# Patient Record
Sex: Male | Born: 1950 | Race: White | Hispanic: No | Marital: Married | Smoking: Never smoker
Health system: Southern US, Community
[De-identification: ages and names within clinical notes are randomized; demographics above are authoritative.]

## PROBLEM LIST (undated history)

## (undated) DIAGNOSIS — Z5189 Encounter for other specified aftercare: Secondary | ICD-10-CM

## (undated) DIAGNOSIS — T7840XA Allergy, unspecified, initial encounter: Secondary | ICD-10-CM

## (undated) DIAGNOSIS — R9431 Abnormal electrocardiogram [ECG] [EKG]: Principal | ICD-10-CM

## (undated) DIAGNOSIS — I10 Essential (primary) hypertension: Secondary | ICD-10-CM

## (undated) DIAGNOSIS — C801 Malignant (primary) neoplasm, unspecified: Secondary | ICD-10-CM

## (undated) HISTORY — PX: LUMBAR LAMINECTOMY: SHX95

## (undated) HISTORY — DX: Abnormal electrocardiogram (ECG) (EKG): R94.31

## (undated) HISTORY — PX: OTHER SURGICAL HISTORY: SHX169

## (undated) HISTORY — DX: Essential (primary) hypertension: I10

## (undated) HISTORY — DX: Allergy, unspecified, initial encounter: T78.40XA

## (undated) HISTORY — DX: Malignant (primary) neoplasm, unspecified: C80.1

## (undated) HISTORY — PX: PROSTATECTOMY: SHX69

## (undated) HISTORY — DX: Encounter for other specified aftercare: Z51.89

---

## 1983-08-08 HISTORY — PX: BACK SURGERY: SHX140

## 2001-12-13 ENCOUNTER — Encounter: Payer: Self-pay | Admitting: Emergency Medicine

## 2001-12-13 ENCOUNTER — Emergency Department (HOSPITAL_COMMUNITY): Admission: EM | Admit: 2001-12-13 | Discharge: 2001-12-13 | Payer: Self-pay | Admitting: Emergency Medicine

## 2001-12-17 ENCOUNTER — Emergency Department (HOSPITAL_COMMUNITY): Admission: EM | Admit: 2001-12-17 | Discharge: 2001-12-17 | Payer: Self-pay | Admitting: Emergency Medicine

## 2001-12-17 ENCOUNTER — Encounter: Payer: Self-pay | Admitting: Urology

## 2001-12-19 ENCOUNTER — Encounter: Payer: Self-pay | Admitting: Urology

## 2001-12-19 ENCOUNTER — Ambulatory Visit (HOSPITAL_BASED_OUTPATIENT_CLINIC_OR_DEPARTMENT_OTHER): Admission: RE | Admit: 2001-12-19 | Discharge: 2001-12-19 | Payer: Self-pay | Admitting: Urology

## 2003-09-17 ENCOUNTER — Emergency Department (HOSPITAL_COMMUNITY): Admission: EM | Admit: 2003-09-17 | Discharge: 2003-09-17 | Payer: Self-pay | Admitting: Emergency Medicine

## 2007-06-12 ENCOUNTER — Ambulatory Visit (HOSPITAL_COMMUNITY): Admission: RE | Admit: 2007-06-12 | Discharge: 2007-06-12 | Payer: Self-pay | Admitting: Urology

## 2007-08-08 HISTORY — PX: LYMPHADENECTOMY: SHX15

## 2007-08-29 ENCOUNTER — Inpatient Hospital Stay (HOSPITAL_COMMUNITY): Admission: RE | Admit: 2007-08-29 | Discharge: 2007-08-30 | Payer: Self-pay | Admitting: Urology

## 2007-08-29 ENCOUNTER — Encounter: Payer: Self-pay | Admitting: Urology

## 2008-06-04 ENCOUNTER — Ambulatory Visit: Payer: Self-pay | Admitting: Family Medicine

## 2008-06-04 DIAGNOSIS — C61 Malignant neoplasm of prostate: Secondary | ICD-10-CM

## 2008-06-04 DIAGNOSIS — I10 Essential (primary) hypertension: Secondary | ICD-10-CM

## 2008-06-04 DIAGNOSIS — E785 Hyperlipidemia, unspecified: Secondary | ICD-10-CM

## 2009-01-13 ENCOUNTER — Ambulatory Visit (HOSPITAL_BASED_OUTPATIENT_CLINIC_OR_DEPARTMENT_OTHER): Admission: RE | Admit: 2009-01-13 | Discharge: 2009-01-13 | Payer: Self-pay | Admitting: Specialist

## 2010-11-14 LAB — POCT I-STAT 4, (NA,K, GLUC, HGB,HCT)
Glucose, Bld: 103 mg/dL — ABNORMAL HIGH (ref 70–99)
Potassium: 4 mEq/L (ref 3.5–5.1)

## 2010-12-20 NOTE — Op Note (Signed)
NAME:  Nicholas Wallace, Nicholas Wallace NO.:  [id number]   MEDICAL RECORD NO.:  [id number]          PATIENT TYPE:  INP   LOCATION:  0004                         FACILITY:  [facility]   PHYSICIAN:  Excell Seltzer. Annabell Howells, M.D.    DATE OF BIRTH:  06/14/1951   DATE OF PROCEDURE:  08/29/2007  DATE OF DISCHARGE:                               OPERATIVE REPORT   PROCEDURE:  Robot-assisted laparoscopic radical prostatectomy with  pelvic lymphadenectomy.   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   POSTOPERATIVE DIAGNOSIS:  T1C Gleason 7, 4 + 3, adenocarcinoma of the  prostate.   SURGEON:  Dr. Bjorn Pippin.   ASSISTANT:  Dr. Heloise Purpura.   ANESTHESIA:  General.   SPECIMEN:  Prostate and seminal vesicles right and left pelvic lymph  nodes.   DRAINS:  #10 Blake drain and 20-French coude Foley catheter.   BLOOD LOSS:  200 mL.   COMPLICATIONS:  None.   INDICATIONS:  Mr. Avakian is a 60 year old white male who was found to  have an elevated PSA of 4.3.  Biopsy demonstrated a T1C Gleason 7, 4 +  3, adenocarcinoma of prostate involving the right apical biopsies.  He  elected radical prostatectomy.  It was felt that node dissection was  indicated because of the grade of his disease.   FINDINGS OF PROCEDURE:  The patient was given Ancef.  He was fitted with  PAS hose.  He was taken to the operating room where general anesthetic  was induced.  He was placed in the lithotomy position.  All pressure  points were appropriately padded.  A red rubber rectal catheter was  placed and secured and connected to an A[place] syringe.  His abdomen was  clipped.  He was prepped with Betadine solution and draped in usual  sterile fashion.  A Foley catheter was inserted, and the bladder was  drained.  At this point, the camera port for the robot was placed 18 cm  superior to the pubis to the left of the umbilicus through a 2-cm  incision and was carried down through the  subcutaneous tissues and  anterior rectus fascia with a Bovie, followed by a nick in posterior  sheath and entry into the abdomen with the tip of a hemostat.  Finger  was placed within the port site to ensure appropriate placement.  A 12-  mm camera port was then placed.  Figure-of-eight 0 Vicryl was used to  occlude the incision around the port.  The laparoscopic camera was then  placed, and the abdomen was inspected.  No evidence of adhesions were  noted.  With the abdomen under insufflation, the remaining port sites  including three 8-mm ports, a 12-mm assistant port, and a 5-mm assistant  port were marked, infiltrated local anesthetic and placed in the  standard configuration.  Once the ports were all positioned, the robot  was brought in the field and docked.  The patient had been positioned in  steep Trendelenburg as per routine.  Once the robot was docked, the  dissection was initiated by taking  down the bladder from the anterior  abdominal wall.  The obliterated umbilical arteries were divided  followed by the urachus.  The bladder had been filled for this portion  of procedure.  It was then drained.  The anterior prostate was then  exposed and then defatted.  The endopelvic fascia was incised on the  right, and the lateral plane was developed.  The endopelvic fascia was  incised on the left, and lateral plane was developed.  The pubic  prostatic ligaments were taken down, the dorsal vein was identified  along the groove adjacent to the urethra on each side.  An Endo-GIA was  then used to divide the dorsal vein complex in the usual fashion.  Once  the dorsal vein complex had been divided, the anterior bladder neck was  identified with traction on the Foley and then divided.  Once the  bladder neck had been entered, the Foley catheter was identified and  brought out into the wound and used to provide anterior traction.  The  posterior bladder neck was then divided with great care  taken to avoid  posterior injury to the bladder or ureters.  The dissection was carried  down until the ampulla of the vas were identified.  The left vasal  ampule was dissected out, divided, and used to provide anterior  traction.  This was followed by the left seminal vesicle, followed by  the right ampulla vas, and the right seminal vesicle.  Once the  structures had been elevated, the posterior attachments were divided  exposing the space between the prostate and Denonvilliers fascia which  was then developed.  The dissection was carried out to apex and  laterally as far as possible on each side.  At this point, we turned our  attention to the nerve spare.  The prostatic fascia was incised on the  right, and the neurovascular bundle was developed and reflected away  from the prostate with gentle sharp and blunt dissection.  This was then  repeated on the left.  Once the neurovascular bundles had been  developed, the pedicles were divided using large hemoclips, and the  remaining lateral attachments of the prostate were dissected out to the  apex.  The remaining dorsal vein complex was then divided with the  cautery scissors.  The urethra was divided sharply using cold scissors,  and few remaining rectourethral Allis attachments were taken down.  The  specimen was then moved out of the pelvis for the remainder of the case.  At this point, inspection of the fossa revealed no evidence of rectal  injury or active bleeding.  We then turned our attention to the lymph  node dissection.  The right lymph node packet was then developed with  marginis of dissection being the external iliac vein, the obturator  nerve, the circumflex iliac vein, and the bifurcation of the iliac  artery.  Large clips were used to control the proximal and distal  lymphatic channels.  This procedure was then repeated on the left.  No  obvious nodal disease was noted.  Once the lymph node dissection had  been  completed, we turned our attention to the urethral anastomosis.  The pelvis was quite narrow, but with the use of 2-0 Vicryl sutures to  reapproximate the cut edges of Denonvilliers with the rectourethralis  followed by a second 2-0 Vicryl stitch at the posterior bladder neck and  posterior urethra, the bladder neck and urethral stump were brought  together where a running  4-0 Monocryl anastomosis was performed in the  usual fashion.  Once the anastomotic suture had been completed and tied,  a fresh 20-French coude Foley catheter was placed.  Balloon was filled  with 15 mL of sterile fluid.  The catheter was irrigated.  There was no  evidence of anastomotic leak or bladder injury.  At this point, a round  Blake drain was placed through the fourth arm port and positioned in the  pelvis.  The robot was then undocked, an entrapment sac was placed  through the camera port, and the prostate was secured within the  entrapment sac.  The remaining ports were then removed.  The 12-mm  assistant port was closed using a needle passer and 0 Vicryl suture.  The remaining ports were removed under direct vision.  Once the camera  port had been removed, the specimen was then brought out through the  periumbilical incision by extending the fascial incision sufficient for  the purpose.  Once the specimen had been removed, the anterior rectus  fascia was closed using a running 0 Vicryl suture.  The skin of all the  port sites was then closed with clips.  The patient's catheter was  irrigated once again with return of blue-tinged urine and no obvious  clots.  Catheter was placed to straight drainage.  The  [place] drain which had been secured to skin with a nylon was secured to  bulb suction.  A dressing was applied to his incisions.  He was taken  down from lithotomy position.  His anesthetic was reversed, and he was  moved to recovery room in stable condition.  There were no  complications.      Excell Seltzer.  Annabell Howells, M.D.  Electronically Signed     JJW/MEDQ  D:  08/29/2007  T:  08/29/2007  Job:  [id number]   cc:   Jonita Albee, M.D.  Fax: [phone]

## 2010-12-20 NOTE — Op Note (Signed)
NAME:  Nicholas Wallace, Nicholas Wallace NO.:  [id number]   MEDICAL RECORD NO.:  [id number]          PATIENT TYPE:  AMB   LOCATION:  [facility]                         FACILITY:  [facility]   PHYSICIAN:  Jene Every, M.D.    DATE OF BIRTH:  Sep 10, 1950   DATE OF PROCEDURE:  01/13/2009  DATE OF DISCHARGE:                               OPERATIVE REPORT   PREOPERATIVE DIAGNOSIS:  Medial meniscus tear, right knee.   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   PROCEDURE PERFORMED:  1. __________ knee arthroscopy.  2. Removal of loose body.  3. Partial medial meniscectomy.  4. Chondroplasty and extensive debridement of the medial tibial      plateau femoral condyle and patella.   BRIEF HISTORY:  A 60 year old with medial meniscus tear on MRI,  swelling, pain, giving way, indicated for diagnosis and treatment,  partial medial meniscectomy and debridement.  Risks and benefits  discussed including bleeding, infection, no change in symptoms,  worsening symptoms, need for repeat debridement, total knee, anesthetic  complications, etc.   TECHNIQUE:  The patient in supine position.  After induction of adequate  anesthesia and 2 g of Kefzol, the right lower extremity was prepped in  the usual sterile fashion.  A lateral parapatellar portal and a  superomedial parapatellar portal was fashioned with a #11 blade.  Ingress cannula atraumatically placed.  Irrigant was utilized to  insufflate the joint.  Under direct visualization, medial parapatellar  portal was fashioned with a #11 blade after localization with an 18-  gauge needle sparing the medial meniscus.  Noted was a large loose body  osteochondral of the medial compartment.  This was retrieved with a  pituitary measuring 1 x 1 cm.  Crater in the medial femoral condyle was  noted and cartilage grade 3 significant.  This was debrided and  chondroplasty performed  here.  Radial tear of the medial meniscus was  noted at the posterior horn.  It was resected back to a stable base.  This was touched with a probe.  We trimmed the inside of the anterior  edge as well.  A chondroplasty was performed of the femoral condyle.  Significant pathology was noted in the posterior portion of the condyle,  tibial plateau and this meniscus.  Remnant was stable to probe  palpation.   ACL and PCL were unremarkable.  Lateral part revealed essentially normal  femoral condyle, mild grade 2 changes of the tibial plateau, mild  degenerative changes of the meniscus, stable to probe palpation.   Suprapatellar pouch revealed grade 3 changes of the patella extensively.  Chondroplasty was performed here to a stable base.  There was normal  patellofemoral tracking.  Sulcus was unremarkable.  Gutters were  unremarkable as well.   I re-examined the medial compartment.  No further pathology amenable to  arthroscopic intervention.  Knee was copiously lavaged, and all  instrumentation was removed.  Portals were closed with 4-0 nylon simple  sutures, and  0.25% Marcaine with epinephrine was infiltrated in the joint.  Wound was  dressed sterilely, awoken without difficulty, and transported to  recovery room in satisfactory condition.   The patient tolerated the procedure well.  There were no complications.      Jene Every, M.D.  Electronically Signed     JB/MEDQ  D:  01/13/2009  T:  01/13/2009  Job:  [id number]

## 2011-04-27 LAB — BASIC METABOLIC PANEL
BUN: 11
Chloride: 108
Glucose, Bld: 123 — ABNORMAL HIGH
Potassium: 4
Sodium: 141

## 2011-04-27 LAB — TYPE AND SCREEN: Antibody Screen: NEGATIVE

## 2011-04-27 LAB — HEMOGLOBIN AND HEMATOCRIT, BLOOD
HCT: 45.2
Hemoglobin: 14.5

## 2011-04-27 LAB — ABO/RH: ABO/RH(D): O POS

## 2011-10-02 ENCOUNTER — Ambulatory Visit (INDEPENDENT_AMBULATORY_CARE_PROVIDER_SITE_OTHER): Payer: BC Managed Care – PPO | Admitting: Physician Assistant

## 2011-10-02 VITALS — BP 119/73 | HR 68 | Temp 98.3°F | Resp 16 | Ht 70.0 in | Wt 240.8 lb

## 2011-10-02 DIAGNOSIS — J069 Acute upper respiratory infection, unspecified: Secondary | ICD-10-CM

## 2011-10-02 MED ORDER — AMOXICILLIN 875 MG PO TABS: 875.0000 mg | ORAL_TABLET | Freq: Two times a day (BID) | ORAL | Status: AC

## 2011-10-02 NOTE — Progress Notes (Signed)
  Subjective:    Patient ID: Nicholas Wallace, male    DOB: [birth year], 61 y.o.   MRN: [id number]  HPI Mr. Hoppe c/o 10 days of URI symptoms.  Started in his head with congestion and drainage. Pressure has decreased since using a nasal spray that he has.  Now in his throat; feels like there is mucous that won't clear.  Thick mucous comes up when he coughs.  Mucinex has helped a little.   Review of Systems  HENT: Positive for congestion, rhinorrhea and sinus pressure. Negative for sore throat.   Respiratory: Positive for cough.   Musculoskeletal: Positive for myalgias.  Neurological: Positive for headaches.   .    Objective:   Physical Exam  Constitutional: He appears well-developed and well-nourished.  HENT:  Right Ear: Tympanic membrane normal.  Left Ear: Tympanic membrane normal.  Nose: Mucosal edema and rhinorrhea present.  Mouth/Throat: Posterior oropharyngeal erythema present.  Cardiovascular: Normal rate and regular rhythm.   Pulmonary/Chest: Effort normal and breath sounds normal.  Lymphadenopathy:    He has no cervical adenopathy.         Assessment & Plan:  URI  Amoxicillin for 10 days.  Continue nasal spray and Mucinex.  Call if symptoms worsen.  Note for work today.

## 2011-11-09 ENCOUNTER — Telehealth: Payer: Self-pay

## 2011-11-09 NOTE — Telephone Encounter (Signed)
Spoke with Nicholas Wallace, no recent PSA level done.

## 2011-11-09 NOTE — Telephone Encounter (Signed)
BOBBIE FROM [facility] WOULD LIKE TO KNOW IF THE DR DID A PSA PANEL ON PT PLEASE CALL [phone]

## 2012-01-05 ENCOUNTER — Ambulatory Visit (INDEPENDENT_AMBULATORY_CARE_PROVIDER_SITE_OTHER): Payer: BC Managed Care – PPO | Admitting: Emergency Medicine

## 2012-01-05 ENCOUNTER — Other Ambulatory Visit: Payer: Self-pay | Admitting: Emergency Medicine

## 2012-01-05 VITALS — BP 117/79 | HR 56 | Temp 97.9°F | Resp 16 | Ht 70.0 in | Wt 240.0 lb

## 2012-01-05 DIAGNOSIS — Z0000 Encounter for general adult medical examination without abnormal findings: Secondary | ICD-10-CM

## 2012-01-05 DIAGNOSIS — I10 Essential (primary) hypertension: Secondary | ICD-10-CM

## 2012-01-05 DIAGNOSIS — R5381 Other malaise: Secondary | ICD-10-CM

## 2012-01-05 DIAGNOSIS — E7800 Pure hypercholesterolemia, unspecified: Secondary | ICD-10-CM

## 2012-01-05 DIAGNOSIS — Z8546 Personal history of malignant neoplasm of prostate: Secondary | ICD-10-CM

## 2012-01-05 DIAGNOSIS — B351 Tinea unguium: Secondary | ICD-10-CM

## 2012-01-05 DIAGNOSIS — R9431 Abnormal electrocardiogram [ECG] [EKG]: Secondary | ICD-10-CM

## 2012-01-05 LAB — COMPREHENSIVE METABOLIC PANEL
ALT: 27 U/L (ref 0–53)
AST: 26 U/L (ref 0–37)
Albumin: 4.8 g/dL (ref 3.5–5.2)
CO2: 23 mEq/L (ref 19–32)
Calcium: 9.8 mg/dL (ref 8.4–10.5)
Chloride: 106 mEq/L (ref 96–112)
Creat: 1.12 mg/dL (ref 0.50–1.35)
Potassium: 4.5 mEq/L (ref 3.5–5.3)
Sodium: 140 mEq/L (ref 135–145)
Total Protein: 7.3 g/dL (ref 6.0–8.3)

## 2012-01-05 LAB — POCT UA - MICROSCOPIC ONLY
Crystals, Ur, HPF, POC: NEGATIVE
Mucus, UA: NEGATIVE
WBC, Ur, HPF, POC: NEGATIVE
Yeast, UA: NEGATIVE

## 2012-01-05 LAB — TSH: TSH: 0.873 u[IU]/mL (ref 0.350–4.500)

## 2012-01-05 LAB — POCT URINALYSIS DIPSTICK
Blood, UA: NEGATIVE
Ketones, UA: NEGATIVE
Protein, UA: NEGATIVE
Spec Grav, UA: 1.025
Urobilinogen, UA: 0.2

## 2012-01-05 LAB — POCT CBC
HCT, POC: 53.1 % (ref 43.5–53.7)
Hemoglobin: 16.9 g/dL (ref 14.1–18.1)
Lymph, poc: 3.5 — AB (ref 0.6–3.4)
MCHC: 31.8 g/dL (ref 31.8–35.4)
MPV: 10.8 fL (ref 0–99.8)
POC Granulocyte: 4.3 (ref 2–6.9)
RBC: 5.57 M/uL (ref 4.69–6.13)

## 2012-01-05 LAB — IFOBT (OCCULT BLOOD): IFOBT: POSITIVE

## 2012-01-05 MED ORDER — CICLOPIROX 8 % EX SOLN: Freq: Every day | CUTANEOUS | Status: AC

## 2012-01-05 MED ORDER — ROSUVASTATIN CALCIUM 20 MG PO TABS: 20.0000 mg | ORAL_TABLET | Freq: Every day | ORAL | Status: DC

## 2012-01-05 MED ORDER — LISINOPRIL 20 MG PO TABS: 20.0000 mg | ORAL_TABLET | Freq: Every day | ORAL | Status: DC

## 2012-01-05 NOTE — Progress Notes (Signed)
  Subjective:    Patient ID: Nicholas Wallace, male    DOB: 27-Jun-1951, 61 y.o.   MRN: [id number]  HPI patient presents for his yearly physical exam. His major complaints are to followup on his blood pressure and cholesterol. He also is concerned about possible sleep disorder. His significant fatigue at the end of the day and falls asleep easily at the end of the day.    Review of Systems  Constitutional: Positive for fatigue.  HENT: Positive for trouble swallowing, neck stiffness, postnasal drip and sinus pressure.   Gastrointestinal: Positive for blood in stool.       Patient has recently had some trouble with food sticking in his throat when he eats. It is unclear whether this is primarily with beef and chicken or with any type of food. He also has had some episodes of rectal bleeding. His last colonoscopy was approximately 9 years ago.  Neurological: Positive for weakness.       Objective:   Physical Exam  Constitutional: He appears well-developed and well-nourished.  HENT:  Head: Normocephalic.  Right Ear: External ear normal.  Left Ear: External ear normal.  Eyes: Pupils are equal, round, and reactive to light.  Neck: No tracheal deviation present. No thyromegaly present.  Cardiovascular: Normal rate and normal heart sounds.  Exam reveals no gallop and no friction rub.   No murmur heard. Pulmonary/Chest: Effort normal and breath sounds normal.  Abdominal: Soft. There is no tenderness. There is no rebound.  Genitourinary:       His prostate is surgically absent there is no bleeding noted.  Musculoskeletal: Normal range of motion.  Neurological: He is alert. He displays normal reflexes. No cranial nerve deficit. Coordination normal.  Skin: Skin is warm and dry.  Psychiatric: He has a normal mood and affect. His behavior is normal. Thought content normal.   EKG normal sinus rhythm with T-wave inversion in leads 3 and aVF. Previous EKG showed T-wave inversion in   3 but not in  aVF.       Assessment & Plan:   Plan a referral to GI and cardiology. Patient has a history of prostate cancer and PSA was done today. Routine labs were done to followup on his hypertension and high cholesterol. He also has symptoms of obstructive sleep apnea. He had a previous test performed before but did not go through with a followup CPAP titration so has not been on treatment for OSA

## 2012-01-06 LAB — PSA: PSA: <0.01 ng/mL (ref <=4.00)

## 2012-01-09 ENCOUNTER — Encounter: Payer: Self-pay | Admitting: *Deleted

## 2012-01-09 ENCOUNTER — Telehealth: Payer: Self-pay | Admitting: *Deleted

## 2012-01-09 NOTE — Telephone Encounter (Signed)
Please add a lipid panel to the patient's blood that is already in the lab

## 2012-01-09 NOTE — Telephone Encounter (Signed)
Pt thought we had ordered lipid and I did not see it in the labs can we add this on?  If so please send lab pool.  Thanks.  They should hold his blood for a couple more days.

## 2012-01-10 LAB — LIPID PANEL
HDL: 35 mg/dL — ABNORMAL LOW (ref >39)
LDL Cholesterol: 143 mg/dL — ABNORMAL HIGH (ref 0–99)
Total CHOL/HDL Ratio: 6.2 Ratio

## 2012-01-10 NOTE — Telephone Encounter (Signed)
Added lipid panel through [facility], to call pt when results/review have been done.

## 2012-02-19 DIAGNOSIS — R9431 Abnormal electrocardiogram [ECG] [EKG]: Secondary | ICD-10-CM

## 2012-02-19 HISTORY — DX: Abnormal electrocardiogram (ECG) (EKG): R94.31

## 2012-03-01 ENCOUNTER — Encounter: Payer: Self-pay | Admitting: Emergency Medicine

## 2012-03-18 ENCOUNTER — Ambulatory Visit (INDEPENDENT_AMBULATORY_CARE_PROVIDER_SITE_OTHER): Payer: BC Managed Care – PPO | Admitting: Family Medicine

## 2012-03-18 VITALS — BP 128/74 | HR 74 | Temp 98.9°F | Resp 18 | Ht 70.0 in | Wt 243.0 lb

## 2012-03-18 DIAGNOSIS — J029 Acute pharyngitis, unspecified: Secondary | ICD-10-CM

## 2012-03-18 DIAGNOSIS — N400 Enlarged prostate without lower urinary tract symptoms: Secondary | ICD-10-CM

## 2012-03-18 MED ORDER — TAMSULOSIN HCL 0.4 MG PO CAPS: 0.4000 mg | ORAL_CAPSULE | Freq: Every day | ORAL | Status: DC

## 2012-03-18 MED ORDER — TADALAFIL 5 MG PO TABS: 2.5000 mg | ORAL_TABLET | Freq: Every day | ORAL | Status: DC | PRN

## 2012-03-18 MED ORDER — MAGIC MOUTHWASH W/LIDOCAINE: 10.0000 mL | Freq: Four times a day (QID) | ORAL | Status: DC | PRN

## 2012-03-18 MED ORDER — AMOXICILLIN 875 MG PO TABS: 875.0000 mg | ORAL_TABLET | Freq: Two times a day (BID) | ORAL | Status: AC

## 2012-03-18 NOTE — Progress Notes (Signed)
61 yo man with two problems: 1-sore throat and malaise x 24 hours, progressive, recurrent, assoc with eye ache, always responds to amoxil\ 2-urinary frequency and urgency despite past TURP, worse lately, wants to try daily cialis  Objective:  NAD Throat red with exudates Neck:  No adenop tM's normal  Assessment: 1. Pharyngitis  Alum & Mag Hydroxide-Simeth (MAGIC MOUTHWASH W/LIDOCAINE) SOLN, amoxicillin (AMOXIL) 875 MG tablet  2. Prostatism  Tamsulosin HCl (FLOMAX) 0.4 MG CAPS, tadalafil (CIALIS) 5 MG tablet

## 2012-05-09 ENCOUNTER — Encounter: Payer: Self-pay | Admitting: Family Medicine

## 2012-05-09 DIAGNOSIS — R9431 Abnormal electrocardiogram [ECG] [EKG]: Secondary | ICD-10-CM | POA: Insufficient documentation

## 2012-06-12 ENCOUNTER — Ambulatory Visit (INDEPENDENT_AMBULATORY_CARE_PROVIDER_SITE_OTHER): Payer: BC Managed Care – PPO | Admitting: Family Medicine

## 2012-06-12 VITALS — BP 156/92 | HR 52 | Temp 97.8°F | Resp 16 | Ht 71.5 in | Wt 242.0 lb

## 2012-06-12 DIAGNOSIS — Z79899 Other long term (current) drug therapy: Secondary | ICD-10-CM

## 2012-06-12 DIAGNOSIS — M6530 Trigger finger, unspecified finger: Secondary | ICD-10-CM

## 2012-06-12 DIAGNOSIS — J329 Chronic sinusitis, unspecified: Secondary | ICD-10-CM

## 2012-06-12 DIAGNOSIS — E785 Hyperlipidemia, unspecified: Secondary | ICD-10-CM

## 2012-06-12 LAB — LIPID PANEL
HDL: 33 mg/dL — ABNORMAL LOW (ref >39)
LDL Cholesterol: 59 mg/dL (ref 0–99)
Total CHOL/HDL Ratio: 3.4 Ratio

## 2012-06-12 LAB — POCT CBC
Hemoglobin: 15.8 g/dL (ref 14.1–18.1)
Lymph, poc: 3.5 — AB (ref 0.6–3.4)
MCH, POC: 30.5 pg (ref 27–31.2)
MCHC: 31.1 g/dL — AB (ref 31.8–35.4)
MCV: 98 fL — AB (ref 80–97)
MID (cbc): 0.7 (ref 0–0.9)
POC MID %: 7.9 %M (ref 0–12)
RBC: 5.18 M/uL (ref 4.69–6.13)
WBC: 9.4 10*3/uL (ref 4.6–10.2)

## 2012-06-12 LAB — COMPREHENSIVE METABOLIC PANEL
ALT: 20 U/L (ref 0–53)
AST: 19 U/L (ref 0–37)
Albumin: 4.1 g/dL (ref 3.5–5.2)
Alkaline Phosphatase: 51 U/L (ref 39–117)
Potassium: 4.6 mEq/L (ref 3.5–5.3)
Sodium: 141 mEq/L (ref 135–145)
Total Bilirubin: 0.8 mg/dL (ref 0.3–1.2)
Total Protein: 6.3 g/dL (ref 6.0–8.3)

## 2012-06-12 MED ORDER — MELOXICAM 15 MG PO TABS: 15.0000 mg | ORAL_TABLET | Freq: Every day | ORAL | Status: DC

## 2012-06-12 MED ORDER — GUAIFENESIN-CODEINE 100-10 MG/5ML PO SYRP: 5.0000 mL | ORAL_SOLUTION | Freq: Three times a day (TID) | ORAL | Status: DC | PRN

## 2012-06-12 MED ORDER — AZITHROMYCIN 250 MG PO TABS: ORAL_TABLET | ORAL | Status: DC

## 2012-06-12 MED ORDER — BENZONATATE 100 MG PO CAPS: 100.0000 mg | ORAL_CAPSULE | Freq: Three times a day (TID) | ORAL | Status: DC | PRN

## 2012-06-12 NOTE — Patient Instructions (Addendum)
Trigger Finger Trigger finger (digital tendinitis and stenosing tenosynovitis) is a common disorder that causes an often painful catching of the fingers or thumb. It occurs as a clicking, snapping or locking of a finger in the palm of the hand. The reason for this is that there is a problem with the tendons which flex the fingers sliding smoothly through their sheaths. The cause of this may be inflammation of the tendon and sheath, or from a thickening or nodule in the tendon. The condition may occur in any finger or a couple fingers at the same time. The cause may be overuse while doing the same activity over and over again with your hands.  Tendons are the tough cords that connect the muscles to bones. Muscles and tendons are part of the system which allows your body to move. When muscles contract in the forearm on the palm side, they pull the tendons toward the elbow and cause the fingers and thumb to bend (flex) toward the palm. These are the flexor tendons. The tendons slide through a slippery smooth membrane (synovium) which is called the tendon sheath. The sheaths have areas of tough fibrous tissues surrounding them which hold the tendons close to the bone. These are called pulleys because they work like a pulley. The first pulley is in the palm of the hand near the crease which runs across your palm. If the area of the tendon thickening is near the pulley, the tendon cannot slide smoothly through the pulley and this causes the trigger finger. The finger may lock with the finger curled or suddenly straighten out with a snap. This is more common in patients with rheumatoid arthritis and diabetes. Left untreated, the condition may get worse to the point where the finger becomes locked in flexion, like making a fist, or less commonly locked with the finger straightened out. DIAGNOSIS  Your caregiver will easily make this diagnosis on examination. TREATMENT   Splinting for 6 to 8 weeks of time may be  helpful. Use the splints as your caregiver suggests.  Heat used for twenty minutes at least four times a day followed by ice packs for twenty minutes unless directed otherwise by your caregiver may be helpful. If you find either heat or cold seems to be making the problem worse, quit using them and ask your caregiver for directions.  Cortisone injections along with splinting may speed up recovery. Several injections may be required. Cortisone may give relief after one injection.  Only take over-the-counter or prescription medicines for pain, discomfort, or fever as directed by your caregiver.  Surgery is another treatment that may be used if conservative treatments using injection and splinting does not work. Surgery can be minor without incisions (a cut does not have to be made) and can be done with a needle through the skin. No stitches are needed and most patients may return to work the same day.  Other surgical choices involve an open procedure where the surgeon opens the hand through a small incision (cut) and cuts the pulley so the tendon can again slide smoothly. Your hand will still work fine. This small operation requires stitches and the recovery will be a little longer and the incisions will need to be protected until completely healed. You may have to limit your activities for up to 6 months.  Occupational or hand therapy may be required if there is stiffness remaining in the finger. RISKS AND COMPLICATIONS Complications are uncommon but some problems that may occur are:    Recurrence of the trigger finger. This does not mean that the surgery was not well done. It simply means that you may have formed scar tissue following surgery that causes the problem to reoccur.  Infection which could ruin the results of the surgery and can result in a finger which is frozen and can not move normally.  Nerve injury is possible which could result in permanent numbness of one or more fingers. CARE  AFTER SURGERY  Elevate your hand above your heart and use ice as instructed.  Follow instructions regarding finger motion/exercise.  Keep the surgical wound dry for at least 48 hrs or longer if instructed.  Keep your follow-up appointments.  Return to work and normal activities as instructed. SEEK IMMEDIATE MEDICAL CARE IF:  Your problems are getting worse or you do not obtain relief from the treatment. Document Released: 05/13/2004 Document Revised: 10/16/2011 Document Reviewed: 01/05/2009 [employer] Patient Information 2013 [employer].  

## 2012-06-12 NOTE — Progress Notes (Signed)
Subjective:    Patient ID: Nicholas Wallace, male    DOB: [birth year], 61 y.o.   MRN: [id number] Chief Complaint  Patient presents with  . Sore Throat  . Generalized Body Aches  . Hand Pain    pinky and ring finger stiff every morning    HPI  Feels like he has the flu - ache all over, sore throat for about a wk, using cold and flu medicine and throat lozenges w/o relief.  Coughing up some mucous  Checks BP at home and has been normal. DId not take BP meds this a.m.  Is fasting - reports he has been on crestor for about 6 mos now.  For the past sev wks 5th and 4th fingers have both curled/cramped and has to pull out and then won't come back automatically and feel stiff during the day. 5th worse than forth  Snores  Past Medical History  Diagnosis Date  . Abnormal EKG 02/19/2012  . Allergy   . Cancer   . Hypertension      Review of Systems  Constitutional: Positive for chills, diaphoresis, activity change and fatigue. Negative for fever, appetite change and unexpected weight change.  HENT: Positive for congestion, sore throat, neck stiffness and sinus pressure. Negative for ear pain.   Eyes: Positive for itching.  Respiratory: Positive for cough. Negative for shortness of breath.   Cardiovascular: Negative for chest pain.  Gastrointestinal: Negative for nausea, vomiting, abdominal pain, diarrhea and constipation.  Genitourinary: Negative for dysuria.  Musculoskeletal: Positive for myalgias and arthralgias. Negative for joint swelling and gait problem.  Skin: Negative for rash.  Neurological: Positive for headaches. Negative for syncope.  Hematological: Negative for adenopathy.  Psychiatric/Behavioral: Negative for sleep disturbance.      BP 156/92  Pulse 52  Temp 97.8 F (36.6 C) (Oral)  Resp 16  Ht 5' 11.5" (1.816 m)  Wt 242 lb (109.77 kg)  BMI 33.28 kg/m2 Objective:   Physical Exam  Constitutional: He is oriented to person, place, and time. He appears well-developed  and well-nourished. No distress.  HENT:  Head: Normocephalic and atraumatic.  Right Ear: External ear and ear canal normal. Tympanic membrane is retracted. A middle ear effusion is present.  Left Ear: External ear and ear canal normal. Tympanic membrane is retracted. A middle ear effusion is present.  Nose: Mucosal edema and rhinorrhea present. Right sinus exhibits maxillary sinus tenderness. Left sinus exhibits maxillary sinus tenderness.  Mouth/Throat: Uvula is midline and mucous membranes are normal. Posterior oropharyngeal erythema present. No oropharyngeal exudate or posterior oropharyngeal edema.  Eyes: Conjunctivae normal are normal. Right eye exhibits no discharge. Left eye exhibits no discharge. No scleral icterus.  Neck: Normal range of motion. Neck supple. No thyromegaly present.  Cardiovascular: Normal rate, regular rhythm, normal heart sounds and intact distal pulses.   Pulmonary/Chest: Effort normal and breath sounds normal. No respiratory distress.  Musculoskeletal:       Left wrist: He exhibits decreased range of motion, tenderness and bony tenderness. He exhibits no swelling, no effusion and no deformity.  Lymphadenopathy:       Head (right side): Submandibular adenopathy present.       Head (left side): Submandibular adenopathy present.    He has no cervical adenopathy.       Right: No supraclavicular adenopathy present.       Left: No supraclavicular adenopathy present.  Neurological: He is alert and oriented to person, place, and time.  Skin: Skin is warm and dry. He  is not diaphoretic. No erythema.  Psychiatric: He has a normal mood and affect. His behavior is normal.          Results for orders placed in visit on 06/12/12  POCT CBC      Component Value Range   WBC 9.4  4.6 - 10.2 K/uL   Lymph, poc 3.5 (*) 0.6 - 3.4   POC LYMPH PERCENT 36.9  10 - 50 %L   MID (cbc) 0.7  0 - 0.9   POC MID % 7.9  0 - 12 %M   POC Granulocyte 5.2  2 - 6.9   Granulocyte percent  55.2  37 - 80 %G   RBC 5.18  4.69 - 6.13 M/uL   Hemoglobin 15.8  14.1 - 18.1 g/dL   HCT, POC 19.1  47.8 - 53.7 %   MCV 98.0 (*) 80 - 97 fL   MCH, POC 30.5  27 - 31.2 pg   MCHC 31.1 (*) 31.8 - 35.4 g/dL   RDW, POC 29.5     Platelet Count, POC 233  142 - 424 K/uL   MPV 8.7  0 - 99.8 fL    Assessment & Plan:   1. Sinusitis  POCT CBC, benzonatate (TESSALON PERLES) 100 MG capsule, POCT Influenza A/B, guaiFENesin-codeine (ROBITUSSIN AC) 100-10 MG/5ML syrup, Gave pt snap rx for azithromycin (ZITHROMAX) 250 MG tablet which he can fill if he worsens.  2. Other and unspecified hyperlipidemia  POCT CBC, Lipid panel, Comprehensive metabolic panel  3. Encounter for long-term (current) use of other medications  POCT CBC, Lipid panel, Comprehensive metabolic panel  4. Trigger finger of left hand  meloxicam (MOBIC) 15 MG tablet.  Gave pt splints and ace wrap for Left 4th and 5th fingers which he will wear every night and buddy tape for 6-8 wks

## 2012-06-17 ENCOUNTER — Ambulatory Visit (INDEPENDENT_AMBULATORY_CARE_PROVIDER_SITE_OTHER): Payer: BC Managed Care – PPO | Admitting: Family Medicine

## 2012-06-17 VITALS — BP 148/98 | HR 67 | Temp 98.1°F | Resp 18 | Ht 70.0 in | Wt 235.0 lb

## 2012-06-17 DIAGNOSIS — H6120 Impacted cerumen, unspecified ear: Secondary | ICD-10-CM

## 2012-06-17 DIAGNOSIS — J329 Chronic sinusitis, unspecified: Secondary | ICD-10-CM

## 2012-06-17 DIAGNOSIS — E785 Hyperlipidemia, unspecified: Secondary | ICD-10-CM

## 2012-06-17 MED ORDER — ATORVASTATIN CALCIUM 40 MG PO TABS: 40.0000 mg | ORAL_TABLET | Freq: Every day | ORAL | Status: DC

## 2012-06-17 MED ORDER — METHYLPREDNISOLONE ACETATE 80 MG/ML IJ SUSP: 80.0000 mg | Freq: Once | INTRAMUSCULAR | Status: DC

## 2012-06-17 MED ORDER — FLUTICASONE PROPIONATE 50 MCG/ACT NA SUSP: 2.0000 | Freq: Every day | NASAL | Status: DC

## 2012-06-20 ENCOUNTER — Ambulatory Visit (INDEPENDENT_AMBULATORY_CARE_PROVIDER_SITE_OTHER): Payer: BC Managed Care – PPO | Admitting: Emergency Medicine

## 2012-06-20 ENCOUNTER — Ambulatory Visit: Payer: BC Managed Care – PPO

## 2012-06-20 ENCOUNTER — Telehealth: Payer: Self-pay

## 2012-06-20 VITALS — BP 182/108 | HR 65 | Temp 98.3°F | Resp 16 | Ht 70.0 in | Wt 237.8 lb

## 2012-06-20 DIAGNOSIS — R319 Hematuria, unspecified: Secondary | ICD-10-CM

## 2012-06-20 DIAGNOSIS — R109 Unspecified abdominal pain: Secondary | ICD-10-CM

## 2012-06-20 DIAGNOSIS — N50819 Testicular pain, unspecified: Secondary | ICD-10-CM

## 2012-06-20 DIAGNOSIS — N509 Disorder of male genital organs, unspecified: Secondary | ICD-10-CM

## 2012-06-20 LAB — POCT URINALYSIS DIPSTICK
Bilirubin, UA: NEGATIVE
Ketones, UA: NEGATIVE
Nitrite, UA: NEGATIVE
Protein, UA: NEGATIVE
pH, UA: 7

## 2012-06-20 LAB — COMPREHENSIVE METABOLIC PANEL
ALT: 28 U/L (ref 0–53)
AST: 19 U/L (ref 0–37)
Alkaline Phosphatase: 70 U/L (ref 39–117)
CO2: 28 mEq/L (ref 19–32)
Creat: 0.94 mg/dL (ref 0.50–1.35)
Sodium: 140 mEq/L (ref 135–145)
Total Bilirubin: 0.5 mg/dL (ref 0.3–1.2)
Total Protein: 7.3 g/dL (ref 6.0–8.3)

## 2012-06-20 LAB — POCT CBC
Granulocyte percent: 64.3 %G (ref 37–80)
MID (cbc): 0.8 (ref 0–0.9)
MPV: 9.4 fL (ref 0–99.8)
POC LYMPH PERCENT: 29.3 %L (ref 10–50)
POC MID %: 6.4 %M (ref 0–12)
Platelet Count, POC: 268 10*3/uL (ref 142–424)
RBC: 5.75 M/uL (ref 4.69–6.13)
RDW, POC: 14.1 %

## 2012-06-20 LAB — POCT UA - MICROSCOPIC ONLY
Crystals, Ur, HPF, POC: NEGATIVE
Yeast, UA: NEGATIVE

## 2012-06-20 NOTE — Progress Notes (Signed)
Subjective:    Patient ID: Nicholas Wallace, male    DOB: [birth year], 61 y.o.   MRN: [id number]  HPI  Pt feeling much better since we started him on the zpack 5 days ago at last OV but still having a lot of sinus pressure and congestion, ear pressure seems to be getting worse. No further f/c.     Past Medical History  Diagnosis Date  . Abnormal EKG 02/19/2012  . Allergy   . Cancer   . Hypertension    Current Outpatient Prescriptions on File Prior to Visit  Medication Sig Dispense Refill  . aspirin 81 MG tablet Take 81 mg by mouth daily.      . benzonatate (TESSALON PERLES) 100 MG capsule Take 1 capsule (100 mg total) by mouth 3 (three) times daily as needed for cough.  20 capsule  0  . guaiFENesin-codeine (ROBITUSSIN AC) 100-10 MG/5ML syrup Take 5 mLs by mouth 3 (three) times daily as needed for cough.  120 mL  0  . lisinopril (PRINIVIL,ZESTRIL) 20 MG tablet Take 1 tablet (20 mg total) by mouth daily.  30 tablet  12  . meloxicam (MOBIC) 15 MG tablet Take 1 tablet (15 mg total) by mouth daily.  30 tablet  1  . sildenafil (VIAGRA) 100 MG tablet Take 100 mg by mouth daily as needed.      . Tamsulosin HCl (FLOMAX) 0.4 MG CAPS Take 1 capsule (0.4 mg total) by mouth daily.  30 capsule  11  . Alum & Mag Hydroxide-Simeth (MAGIC MOUTHWASH W/LIDOCAINE) SOLN Take 10 mLs by mouth 4 (four) times daily as needed.  240 mL  0  . atorvastatin (LIPITOR) 40 MG tablet Take 1 tablet (40 mg total) by mouth at bedtime.  90 tablet  3  . fluticasone (FLONASE) 50 MCG/ACT nasal spray Place 2 sprays into the nose daily.  16 g  1  . tadalafil (CIALIS) 5 MG tablet Take 0.5 tablets (2.5 mg total) by mouth daily as needed for erectile dysfunction.  30 tablet  10   No current facility-administered medications on file prior to visit.     Review of Systems  Constitutional: Positive for activity change and fatigue. Negative for fever, chills, diaphoresis, appetite change and unexpected weight change.  HENT: Positive  for congestion, rhinorrhea, postnasal drip, sinus pressure and ear discharge. Negative for ear pain, mouth sores, neck pain and neck stiffness.   Respiratory: Negative for cough and shortness of breath.   Cardiovascular: Negative for chest pain.  Gastrointestinal: Negative for nausea, vomiting, abdominal pain, diarrhea and constipation.  Genitourinary: Negative for dysuria.  Musculoskeletal: Negative for myalgias.  Skin: Negative for rash.  Neurological: Positive for headaches. Negative for syncope.  Hematological: Negative for adenopathy.  Psychiatric/Behavioral: Positive for sleep disturbance.      BP 148/98  Pulse 67  Temp 98.1 F (36.7 C) (Oral)  Resp 18  Ht 5\' 10"  (1.778 m)  Wt 235 lb (106.595 kg)  BMI 33.72 kg/m2  SpO2 96% Objective:   Physical Exam  Constitutional: He is oriented to person, place, and time. He appears well-developed and well-nourished. No distress.  HENT:  Head: Normocephalic and atraumatic.  Right Ear: External ear and ear canal normal. Tympanic membrane is injected. A middle ear effusion is present.  Left Ear: External ear and ear canal normal. Tympanic membrane is retracted. A middle ear effusion is present.  Nose: Mucosal edema and rhinorrhea present. Right sinus exhibits maxillary sinus tenderness. Left sinus exhibits maxillary sinus tenderness.  Mouth/Throat: Uvula is midline and mucous membranes are normal. Posterior oropharyngeal erythema present. No oropharyngeal exudate or posterior oropharyngeal edema.  Eyes: Conjunctivae normal are normal. Right eye exhibits no discharge. Left eye exhibits no discharge. No scleral icterus.  Neck: Normal range of motion. Neck supple. No thyromegaly present.  Cardiovascular: Normal rate, regular rhythm, normal heart sounds and intact distal pulses.   Pulmonary/Chest: Effort normal and breath sounds normal. No respiratory distress.  Lymphadenopathy:       Head (right side): Submandibular adenopathy present.        Head (left side): Submandibular adenopathy present.    He has no cervical adenopathy.       Right: No supraclavicular adenopathy present.       Left: No supraclavicular adenopathy present.  Neurological: He is alert and oriented to person, place, and time.  Skin: Skin is warm and dry. He is not diaphoretic. No erythema.  Psychiatric: He has a normal mood and affect. His behavior is normal.           Assessment & Plan:   1. DYSLIPIDEMIA  Pt's is on crestor 20mg  qhs - we checked his lipid panel 5d ago and his LDL is quite low at 59 with total chol 113. Crestor is costing him about $20-25/mo.  I'm not aware of any risk factors that pt has that would require him to have a LDL this low but will go ahead and swtich him to equivilant med of atorvastatin (LIPITOR) 40 MG tablet qhs to help with cost.  Recheck flp in 6 mos  2. Sinusitis  methylPREDNISolone acetate (DEPO-MEDROL) injection 80 mg, fluticasone (FLONASE) 50 MCG/ACT nasal spray  3. Cerumen impaction  Cleaned with bilateral lavage and curette with good result.

## 2012-06-20 NOTE — Telephone Encounter (Signed)
PT STATES HE HAD SEEN DR DAUB ABOUT POSSIBLE KIDNEY STONES AND HE IS IN A LOT OF PAIN. WOULD LIKE TO KNOW IF WE WOULD CALL HIM SOMETHING IN FOR PAIN PLEASE CALL [phone]   [facility] [address]

## 2012-06-20 NOTE — Progress Notes (Signed)
Subjective:    Patient ID: Nicholas Wallace, male    DOB: 08/19/1950, 61 y.o.   MRN: [id number]  HPI  Patient presents with chief complaint of blood in urine.  Noticed pain in groin last night, urinated blood last night and this morning, not bright red but red, pain in testicles  Requests PSA  Review of Systems     Objective:   Physical Exam patient is alert and cooperative not in distress. Abdominal exam reveals tenderness deep in the left lower abdomen. 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  Results for orders placed in visit on 06/20/12  POCT CBC      Component Value Range   WBC 12.8 (*) 4.6 - 10.2 K/uL   Lymph, poc 3.8 (*) 0.6 - 3.4   POC LYMPH PERCENT 29.3  10 - 50 %L   MID (cbc) 0.8  0 - 0.9   POC MID % 6.4  0 - 12 %M   POC Granulocyte 8.2 (*) 2 - 6.9   Granulocyte percent 64.3  37 - 80 %G   RBC 5.75  4.69 - 6.13 M/uL   Hemoglobin 17.5  14.1 - 18.1 g/dL   HCT, POC 40.9 (*) 81.1 - 53.7 %   MCV 98.7 (*) 80 - 97 fL   MCH, POC 30.4  27 - 31.2 pg   MCHC 30.8 (*) 31.8 - 35.4 g/dL   RDW, POC 91.4     Platelet Count, POC 268  142 - 424 K/uL   MPV 9.4  0 - 99.8 fL  IFOBT (OCCULT BLOOD)      Component Value Range   IFOBT Negative    POCT UA - MICROSCOPIC ONLY      Component Value Range   WBC, Ur, HPF, POC 1-4     RBC, urine, microscopic 0-1     Bacteria, U Microscopic trace     Mucus, UA neg     Epithelial cells, urine per micros 0-1     Crystals, Ur, HPF, POC neg     Casts, Ur, LPF, POC neg     Yeast, UA neg    POCT URINALYSIS DIPSTICK      Component Value Range   Color, UA yellow     Clarity, UA clear     Glucose, UA neg     Bilirubin, UA neg     Ketones, UA neg     Spec Grav, UA 1.020     Blood, UA trace-intact     pH, UA 7.0     Protein, UA neg     Urobilinogen, UA 0.2     Nitrite, UA neg     Leukocytes, UA Trace     Results for orders placed in visit on 06/20/12  POCT CBC      Component Value  Range   WBC 12.8 (*) 4.6 - 10.2 K/uL   Lymph, poc 3.8 (*) 0.6 - 3.4   POC LYMPH PERCENT 29.3  10 - 50 %L   MID (cbc) 0.8  0 - 0.9   POC MID % 6.4  0 - 12 %M   POC Granulocyte 8.2 (*) 2 - 6.9   Granulocyte percent 64.3  37 - 80 %G   RBC 5.75  4.69 - 6.13 M/uL   Hemoglobin 17.5  14.1 - 18.1 g/dL   HCT, POC 78.2 (*) 95.6 - 53.7 %   MCV 98.7 (*) 80 - 97 fL  MCH, POC 30.4  27 - 31.2 pg   MCHC 30.8 (*) 31.8 - 35.4 g/dL   RDW, POC 16.1     Platelet Count, POC 268  142 - 424 K/uL   MPV 9.4  0 - 99.8 fL  IFOBT (OCCULT BLOOD)      Component Value Range   IFOBT Negative    POCT UA - MICROSCOPIC ONLY      Component Value Range   WBC, Ur, HPF, POC 1-4     RBC, urine, microscopic 0-1     Bacteria, U Microscopic trace     Mucus, UA neg     Epithelial cells, urine per micros 0-1     Crystals, Ur, HPF, POC neg     Casts, Ur, LPF, POC neg     Yeast, UA neg    POCT URINALYSIS DIPSTICK      Component Value Range   Color, UA yellow     Clarity, UA clear     Glucose, UA neg     Bilirubin, UA neg     Ketones, UA neg     Spec Grav, UA 1.020     Blood, UA trace-intact     pH, UA 7.0     Protein, UA neg     Urobilinogen, UA 0.2     Nitrite, UA neg     Leukocytes, UA Trace     [facility] reading (PRIMARY) by  Dr. Cleta Alberts there is suture visible in the lower pelvis no other abnormalities are seen .     Assessment & Plan:  History and symptoms and signs are most consistent with passage of kidney stone. He does have a slightly elevated white count but received a steroid injection earlier in the week. Given strainers and we'll just observe for now he was advised to return to clinic if he developed worsening abdominal pain.

## 2012-06-21 NOTE — Telephone Encounter (Signed)
Dr. Ellis Parents note states that if pt is having worsening pain to RTC.

## 2012-06-21 NOTE — Telephone Encounter (Signed)
LMOM to RTC for recheck d/t continuing pain. I will try to call again to check pt status if he does not return.

## 2012-06-22 LAB — URINE CULTURE: Colony Count: NO GROWTH

## 2012-07-08 ENCOUNTER — Emergency Department (HOSPITAL_COMMUNITY)
Admission: EM | Admit: 2012-07-08 | Discharge: 2012-07-08 | Disposition: A | Discharge status: Home / Self Care | Payer: BC Managed Care – PPO | Attending: Emergency Medicine | Admitting: Emergency Medicine

## 2012-07-08 ENCOUNTER — Encounter (HOSPITAL_COMMUNITY): Payer: Self-pay | Admitting: *Deleted

## 2012-07-08 ENCOUNTER — Ambulatory Visit (INDEPENDENT_AMBULATORY_CARE_PROVIDER_SITE_OTHER): Payer: BC Managed Care – PPO | Admitting: Family Medicine

## 2012-07-08 ENCOUNTER — Emergency Department (HOSPITAL_COMMUNITY): Payer: BC Managed Care – PPO

## 2012-07-08 VITALS — BP 168/95 | HR 84 | Temp 100.0°F | Resp 16 | Ht 70.0 in | Wt 232.0 lb

## 2012-07-08 DIAGNOSIS — R300 Dysuria: Secondary | ICD-10-CM

## 2012-07-08 DIAGNOSIS — Z7982 Long term (current) use of aspirin: Secondary | ICD-10-CM | POA: Insufficient documentation

## 2012-07-08 DIAGNOSIS — N200 Calculus of kidney: Secondary | ICD-10-CM

## 2012-07-08 DIAGNOSIS — Z8546 Personal history of malignant neoplasm of prostate: Secondary | ICD-10-CM | POA: Insufficient documentation

## 2012-07-08 DIAGNOSIS — R509 Fever, unspecified: Secondary | ICD-10-CM | POA: Insufficient documentation

## 2012-07-08 DIAGNOSIS — R9431 Abnormal electrocardiogram [ECG] [EKG]: Secondary | ICD-10-CM | POA: Insufficient documentation

## 2012-07-08 DIAGNOSIS — N12 Tubulo-interstitial nephritis, not specified as acute or chronic: Secondary | ICD-10-CM | POA: Insufficient documentation

## 2012-07-08 DIAGNOSIS — I10 Essential (primary) hypertension: Secondary | ICD-10-CM | POA: Insufficient documentation

## 2012-07-08 DIAGNOSIS — R82998 Other abnormal findings in urine: Secondary | ICD-10-CM

## 2012-07-08 DIAGNOSIS — Z79899 Other long term (current) drug therapy: Secondary | ICD-10-CM | POA: Insufficient documentation

## 2012-07-08 DIAGNOSIS — Z9109 Other allergy status, other than to drugs and biological substances: Secondary | ICD-10-CM | POA: Insufficient documentation

## 2012-07-08 DIAGNOSIS — R319 Hematuria, unspecified: Secondary | ICD-10-CM | POA: Insufficient documentation

## 2012-07-08 DIAGNOSIS — R339 Retention of urine, unspecified: Secondary | ICD-10-CM | POA: Insufficient documentation

## 2012-07-08 DIAGNOSIS — N209 Urinary calculus, unspecified: Secondary | ICD-10-CM

## 2012-07-08 DIAGNOSIS — N10 Acute tubulo-interstitial nephritis: Secondary | ICD-10-CM

## 2012-07-08 DIAGNOSIS — R8290 Unspecified abnormal findings in urine: Secondary | ICD-10-CM

## 2012-07-08 DIAGNOSIS — R410 Disorientation, unspecified: Secondary | ICD-10-CM

## 2012-07-08 LAB — POCT I-STAT, CHEM 8
Creatinine, Ser: 1.1 mg/dL (ref 0.50–1.35)
Glucose, Bld: 81 mg/dL (ref 70–99)
Hemoglobin: 15 g/dL (ref 13.0–17.0)
Sodium: 142 mEq/L (ref 135–145)
TCO2: 25 mmol/L (ref 0–100)

## 2012-07-08 LAB — POCT CBC
Granulocyte percent: 71.1 %G (ref 37–80)
HCT, POC: 52.1 % (ref 43.5–53.7)
Hemoglobin: 15.9 g/dL (ref 14.1–18.1)
MCV: 99.1 fL — AB (ref 80–97)
POC Granulocyte: 9.4 — AB (ref 2–6.9)
RBC: 5.26 M/uL (ref 4.69–6.13)

## 2012-07-08 LAB — URINALYSIS, ROUTINE W REFLEX MICROSCOPIC
Nitrite: POSITIVE — AB
Protein, ur: 30 mg/dL — AB
Urobilinogen, UA: 1 mg/dL (ref 0.0–1.0)

## 2012-07-08 LAB — URINE MICROSCOPIC-ADD ON

## 2012-07-08 LAB — POCT UA - MICROSCOPIC ONLY

## 2012-07-08 LAB — POCT URINALYSIS DIPSTICK
Bilirubin, UA: NEGATIVE
Glucose, UA: NEGATIVE
Ketones, UA: NEGATIVE
Spec Grav, UA: >=1.03
Urobilinogen, UA: 2

## 2012-07-08 MED ORDER — CIPROFLOXACIN HCL 500 MG PO TABS: 500.0000 mg | ORAL_TABLET | Freq: Two times a day (BID) | ORAL | Status: DC

## 2012-07-08 MED ORDER — ONDANSETRON HCL 4 MG/2ML IJ SOLN
4.0000 mg | Freq: Once | INTRAMUSCULAR | Status: AC
  Administered 2012-07-08: 4 mg via INTRAVENOUS
  Filled 2012-07-08: qty 2

## 2012-07-08 MED ORDER — CEPHALEXIN 500 MG PO CAPS: 500.0000 mg | ORAL_CAPSULE | Freq: Four times a day (QID) | ORAL | Status: DC

## 2012-07-08 MED ORDER — OXYCODONE-ACETAMINOPHEN 5-325 MG PO TABS
2.0000 | ORAL_TABLET | Freq: Once | ORAL | Status: AC
  Administered 2012-07-08: 2 via ORAL
  Filled 2012-07-08: qty 2

## 2012-07-08 MED ORDER — HYDROMORPHONE HCL PF 1 MG/ML IJ SOLN
1.0000 mg | Freq: Once | INTRAMUSCULAR | Status: AC
  Administered 2012-07-08: 1 mg via INTRAVENOUS
  Filled 2012-07-08: qty 1

## 2012-07-08 MED ORDER — CEFTRIAXONE SODIUM 1 G IJ SOLR
1.0000 g | Freq: Once | INTRAMUSCULAR | Status: AC
  Administered 2012-07-08: 1 g via INTRAMUSCULAR

## 2012-07-08 MED ORDER — DEXTROSE 5 % IV SOLN
1.0000 g | Freq: Once | INTRAVENOUS | Status: AC
  Administered 2012-07-08: 1 g via INTRAVENOUS
  Filled 2012-07-08: qty 10

## 2012-07-08 MED ORDER — OXYCODONE-ACETAMINOPHEN 5-325 MG PO TABS: 1.0000 | ORAL_TABLET | Freq: Four times a day (QID) | ORAL | Status: DC | PRN

## 2012-07-08 NOTE — Progress Notes (Signed)
Subjective:   DYSURIA Onset:  4 days  Description: dysuria, low grade fevers, flank pain Modifying factors: hx/o recurrent kidney stones. Followed by [facility]. Had recent flare of kidney stones 1-2 months ago.   Symptoms Urgency:  yes Frequency: yes  Hesitancy:  yes Hematuria:  Dark colored urine Flank Pain:  yes Fever: low grade  Nausea/Vomiting:  Mild nausea  Missed LMP: no STD exposure: no Discharge: no Irritants: no Rash: no  Red Flags   More than 3 UTI's last 12 months:  no PMH of  Diabetes or Immunosuppression:  no Renal Disease/Calculi: yes Urinary Tract Abnormality:  no Instrumentation or Trauma: no       Review of Systems - 12 point ROS negative except as noted above in HPI.   Objective:  Current Outpatient Prescriptions  Medication Sig Dispense Refill  . Alum & Mag Hydroxide-Simeth (MAGIC MOUTHWASH W/LIDOCAINE) SOLN Take 10 mLs by mouth 4 (four) times daily as needed.  240 mL  0  . aspirin 81 MG tablet Take 81 mg by mouth daily.      Marland Kitchen atorvastatin (LIPITOR) 40 MG tablet Take 1 tablet (40 mg total) by mouth at bedtime.  90 tablet  3  . benzonatate (TESSALON PERLES) 100 MG capsule Take 1 capsule (100 mg total) by mouth 3 (three) times daily as needed for cough.  20 capsule  0  . fluticasone (FLONASE) 50 MCG/ACT nasal spray Place 2 sprays into the nose daily.  16 g  1  . guaiFENesin-codeine (ROBITUSSIN AC) 100-10 MG/5ML syrup Take 5 mLs by mouth 3 (three) times daily as needed for cough.  120 mL  0  . lisinopril (PRINIVIL,ZESTRIL) 20 MG tablet Take 1 tablet (20 mg total) by mouth daily.  30 tablet  12  . meloxicam (MOBIC) 15 MG tablet Take 1 tablet (15 mg total) by mouth daily.  30 tablet  1  . sildenafil (VIAGRA) 100 MG tablet Take 100 mg by mouth daily as needed.      . tadalafil (CIALIS) 5 MG tablet Take 0.5 tablets (2.5 mg total) by mouth daily as needed for erectile dysfunction.  30 tablet  10  . Tamsulosin HCl (FLOMAX) 0.4 MG CAPS Take 1 capsule  (0.4 mg total) by mouth daily.  30 capsule  11    Wt Readings from Last 3 Encounters:  07/08/12 232 lb (105.235 kg)  06/20/12 237 lb 12.8 oz (107.865 kg)  06/17/12 235 lb (106.595 kg)   Temp Readings from Last 3 Encounters:  07/08/12 100 F (37.8 C) Oral  06/20/12 98.3 F (36.8 C) Oral  06/17/12 98.1 F (36.7 C) Oral   BP Readings from Last 3 Encounters:  07/08/12 126/88  06/20/12 182/108  06/17/12 148/98   Pulse Readings from Last 3 Encounters:  07/08/12 77  06/20/12 65  06/17/12 67    General: alert and cooperative HEENT: PERRLA and extra ocular movement intact Heart: S1, S2 normal, no murmur, rub or gallop, regular rate and rhythm Lungs: clear to auscultation, no wheezes or rales and unlabored breathing Abdomen: S/+ bowel sounds/ mild LLQ TTP,+ L flank pain  Extremities: extremities normal, atraumatic, no cyanosis or edema Skin:no rashes Neurology: normal without focal findings   Assessment/Plan:   Sxs concerning for complicated cystitis vs. Early pyelonephritis. Noted leukocytosis (WBC 14), pyuria on microscopic UA, and low grade fever.  VS and physical exam reassuring.  There may also be an element of an infected kidney stone given medical history.  Will obtain CT Abd and  Pelvis to better assess renal anatomy.  Rocephin 1mg  IM x1  Cipro x 10 days  Plan for follow up in 24 hours for reassessment of sxs.  Go to ER if sxs worsen in interim pending CT scan findings. Would recommend follow up with urology in 1-2 weeks as GU sxs have become a recurrent issue recently.       The patient and/or caregiver has been counseled thoroughly with regard to treatment plan and/or medications prescribed including dosage, schedule, interactions, rationale for use, and possible side effects and they verbalize understanding. Diagnoses and expected course of recovery discussed and will return if not improved as expected or if the condition worsens. Patient and/or caregiver  verbalized understanding.    Clinical update: Pt with noted worsening confusion while in office in addition to extreme thirst.  Orthostatics obtained WNL.  Plan for pt to be transferred to ER [facility]) via EMS where pt can be further evaluated, including CT Abd and Pelvis.  [facility] EDP Dr. Bebe Shaggy contacted.  PIV w/ NS placed.

## 2012-07-08 NOTE — ED Provider Notes (Signed)
History     CSN: [id number]  Arrival date & time 07/08/12  4098   First MD Initiated Contact with Patient 07/08/12 2046      Chief Complaint  Patient presents with  . Abdominal Pain  . Urinary Retention  . Hematuria  . Fever    (Consider location/radiation/quality/duration/timing/severity/associated sxs/prior treatment) HPI Comments: Patient has had 2 weeks of intermittent flank/abdominal pain.  Though at one point he may have passed a kidney stone which is not unusual for him During thei tie also treated for a sinus infection with antibiotics.  Went to [facility] today for the abdominal pain which has been persistent  today US shows infection and mildly elevated MBC  Was sent due to concern for impacted kidney stone or pyeloneopritis   Patient is a 61 y.o. male presenting with abdominal pain, hematuria, and fever. The history is provided by the patient.  Abdominal Pain The primary symptoms of the illness include abdominal pain, fever and nausea. The primary symptoms of the illness do not include shortness of breath, vomiting, diarrhea or dysuria. The current episode started more than 2 days ago. The onset of the illness was gradual.  The abdominal pain began more than 2 days ago. The pain came on gradually. The abdominal pain has been unchanged since its onset. The abdominal pain is generalized. The abdominal pain does not radiate. The severity of the abdominal pain is 7/10. The abdominal pain is relieved by nothing.  Additional symptoms associated with the illness include hematuria. Symptoms associated with the illness do not include chills, constipation, urgency, frequency or back pain.  Hematuria Irritative symptoms do not include frequency or urgency. Associated symptoms include abdominal pain, fever, flank pain and nausea. Pertinent negatives include no chills, dysuria or vomiting.  Fever Primary symptoms of the febrile illness include fever, abdominal pain and nausea. Primary symptoms do  not include headaches, shortness of breath, vomiting, diarrhea, dysuria or rash.    Past Medical History  Diagnosis Date  . Abnormal EKG 02/19/2012  . Allergy   . Cancer   . Hypertension     Past Surgical History  Procedure Date  . Back surgery 1985    [facility]  . Prostatectomy   . Lymphadenectomy 2009    pelvic lymphadenectomy by Dr. Bjorn Pippin  . Lumbar laminectomy   . Cervical kidney stones     Family History  Problem Relation Age of Onset  . Alzheimer's disease Mother   . Heart disease Maternal Grandmother   . Cancer Paternal Grandmother     History  Substance Use Topics  . Smoking status: Never Smoker   . Smokeless tobacco: Not on file  . Alcohol Use: Yes      Review of Systems  Constitutional: Positive for fever. Negative for chills.  HENT: Positive for sinus pressure.   Respiratory: Negative for shortness of breath.   Gastrointestinal: Positive for nausea and abdominal pain. Negative for vomiting, diarrhea and constipation.  Genitourinary: Positive for hematuria and flank pain. Negative for dysuria, urgency, frequency, decreased urine volume, scrotal swelling and testicular pain.  Musculoskeletal: Negative for back pain.  Skin: Negative for rash and wound.  Neurological: Negative for dizziness, weakness and headaches.    Allergies  Review of patient's allergies indicates no known allergies.  Home Medications   Current Outpatient Rx  Name  Route  Sig  Dispense  Refill  . ASPIRIN 81 MG PO TABS   Oral   Take 81 mg by mouth daily.         Marland Kitchen  ATORVASTATIN CALCIUM 40 MG PO TABS   Oral   Take 1 tablet (40 mg total) by mouth at bedtime.   90 tablet   3   . VITAMIN D 1000 UNITS PO TABS   Oral   Take 1,000 Units by mouth daily.         Marland Kitchen FLUTICASONE PROPIONATE 50 MCG/ACT NA SUSP   Nasal   Place 2 sprays into the nose daily.   16 g   1   . LISINOPRIL 20 MG PO TABS   Oral   Take 1 tablet (20 mg total) by mouth daily.   30 tablet   12   .  MELOXICAM 15 MG PO TABS   Oral   Take 15 mg by mouth daily as needed. Pain/inflammation         . ADULT MULTIVITAMIN W/MINERALS CH   Oral   Take 1 tablet by mouth daily.         Marland Kitchen NAPROXEN SODIUM 220 MG PO TABS   Oral   Take 440 mg by mouth 2 (two) times daily as needed. pain         . SILDENAFIL CITRATE 100 MG PO TABS   Oral   Take 100 mg by mouth daily as needed. Erectile dysfuntion         . TADALAFIL 5 MG PO TABS   Oral   Take 2.5 mg by mouth daily as needed.         Marland Kitchen TAMSULOSIN HCL 0.4 MG PO CAPS   Oral   Take 1 capsule (0.4 mg total) by mouth daily.   30 capsule   11   . CEPHALEXIN 500 MG PO CAPS   Oral   Take 1 capsule (500 mg total) by mouth 4 (four) times daily.   28 capsule   0   . OXYCODONE-ACETAMINOPHEN 5-325 MG PO TABS   Oral   Take 1-2 tablets by mouth every 6 (six) hours as needed for pain.   20 tablet   0     BP 165/86  Pulse 72  Temp 100.1 F (37.8 C)  Resp 20  SpO2 99%  Physical Exam  Constitutional: He is oriented to person, place, and time. He appears well-developed and well-nourished.  HENT:  Head: Normocephalic.  Eyes: Pupils are equal, round, and reactive to light.  Neck: Normal range of motion.  Cardiovascular: Normal rate.   Pulmonary/Chest: Effort normal and breath sounds normal.  Abdominal: Soft. Bowel sounds are normal. He exhibits no distension. There is no tenderness.  Musculoskeletal: Normal range of motion.  Neurological: He is alert and oriented to person, place, and time.    ED Course  Procedures (including critical care time)  Labs Reviewed  URINALYSIS, ROUTINE W REFLEX MICROSCOPIC - Abnormal; Notable for the following:    APPearance TURBID (*)     Hgb urine dipstick MODERATE (*)     Ketones, ur TRACE (*)     Protein, ur 30 (*)     Nitrite POSITIVE (*)     Leukocytes, UA LARGE (*)     All other components within normal limits  URINE MICROSCOPIC-ADD ON - Abnormal; Notable for the following:     Squamous Epithelial / LPF FEW (*)     Bacteria, UA FEW (*)     All other components within normal limits  POCT I-STAT, CHEM 8 - Abnormal; Notable for the following:    Calcium, Ion 1.07 (*)     All other components within  normal limits  URINE CULTURE   Ct Abdomen Pelvis Wo Contrast  07/08/2012  *RADIOLOGY REPORT*  Clinical Data: Abdominal pain; right flank pain.  History of renal stones.  Hematuria and fever.  CT ABDOMEN AND PELVIS WITHOUT CONTRAST  Technique:  Multidetector CT imaging of the abdomen and pelvis was performed following the standard protocol without intravenous contrast.  Comparison: CT of the abdomen and pelvis performed 09/17/2003  Findings: Minimal bibasilar atelectasis is noted.  Diffuse coronary artery calcifications are seen.  The liver and spleen are unremarkable in appearance.  The gallbladder is within normal limits.  The pancreas and adrenal glands are unremarkable.  There is mildly increased bilateral perinephric stranding, slightly more prominent on the left.  On the left side, prominence of the extrarenal pelvis now demonstrates slight apparent wall thickening, raising question for ureteritis.  There is no definite evidence of hydronephrosis.  A tiny 2 mm stone is noted at the lower pole of the left kidney.  No free fluid is identified.  The small bowel is unremarkable in appearance.  The stomach is within normal limits.  No acute vascular abnormalities are seen.  Minimal scattered calcification is noted along the abdominal aorta and its branches.  The appendix is normal in caliber, without evidence for appendicitis.  Scattered diverticulosis is noted along the descending and proximal sigmoid colon, without evidence of diverticulitis.  Apparent minimal wall thickening along the rectum is thought to reflect intraluminal contents.  The bladder is mildly distended and grossly unremarkable in appearance.  The patient is status post prostatectomy.  No inguinal lymphadenopathy is seen.   No acute osseous abnormalities are identified.  There is mild narrowing of the intervertebral disc space at L5-S1.  IMPRESSION:  1.  Mildly increased bilateral perinephric stranding, slightly more prominent on the left.  Slight apparent wall thickening noted along the left extrarenal pelvis, raising question for ureteritis. Underlying pyelonephritis cannot be excluded. 2.  No evidence of hydronephrosis; 2 mm nonobstructing stone incidentally noted at the lower pole of the left kidney. 3.  Scattered diverticulosis noted along the descending and proximal sigmoid colon, without evidence of diverticulitis.  4.  Diffuse coronary artery calcifications seen.   Original Report Authenticated By: Tonia Ghent, M.D.      1. Acute pyelonephritis   2. Kidney stone       MDM  Discussed findings with patient and wife   Fine with going home will see Dr. Cleta Alberts in the morning  Will FU with Dr. Annabell Howells as well  He will return if not getting better or develops any new symptoms         Arman Filter, NP 07/08/12 2317  Arman Filter, NP 07/08/12 2317

## 2012-07-08 NOTE — ED Notes (Signed)
Per EMS, pt coming from [place], reports fever, abd pain, urinary retention and hematuria x 4 days.  Pt reports hx of several kidney stones.

## 2012-07-09 ENCOUNTER — Telehealth: Payer: Self-pay | Admitting: Emergency Medicine

## 2012-07-09 NOTE — Telephone Encounter (Signed)
I have left message for him to call me back.  

## 2012-07-09 NOTE — Telephone Encounter (Signed)
Please call Mr. Breck and check on status. Please be sure he is improving from his his yesterday. Please be sure the he follows up with me tomorrow if he is not significantly improved. It is also okay if he wants to followup with the urologist tomorrow but he needs to followup somewhere.

## 2012-07-09 NOTE — ED Provider Notes (Signed)
Medical screening examination/treatment/procedure(s) were performed by non-physician practitioner and as supervising physician I was immediately available for consultation/collaboration.  Marketta Valadez R. Yakira Duquette, MD 07/09/12 0015 

## 2012-07-10 NOTE — Telephone Encounter (Signed)
Left message for him to call back about this.

## 2012-07-11 ENCOUNTER — Ambulatory Visit (INDEPENDENT_AMBULATORY_CARE_PROVIDER_SITE_OTHER): Payer: BC Managed Care – PPO | Admitting: Internal Medicine

## 2012-07-11 VITALS — BP 150/90 | HR 58 | Temp 98.2°F | Resp 16 | Ht 70.0 in | Wt 228.0 lb

## 2012-07-11 DIAGNOSIS — N12 Tubulo-interstitial nephritis, not specified as acute or chronic: Secondary | ICD-10-CM

## 2012-07-11 LAB — POCT URINALYSIS DIPSTICK
Leukocytes, UA: NEGATIVE
Nitrite, UA: NEGATIVE
Urobilinogen, UA: 0.2

## 2012-07-11 LAB — URINE CULTURE: Colony Count: >=100000

## 2012-07-11 LAB — POCT UA - MICROSCOPIC ONLY
Casts, Ur, LPF, POC: NEGATIVE
Crystals, Ur, HPF, POC: NEGATIVE
Yeast, UA: NEGATIVE

## 2012-07-11 NOTE — Telephone Encounter (Signed)
Spoke w/pt who stated he is doing better but he is going to [facility] this morning for a f/up. Pt thought Dr Cleta Alberts was going to be in office today, but advised pt that another provider can see him for recheck and pt agreed to come in.

## 2012-07-11 NOTE — Progress Notes (Signed)
  Subjective:    Patient ID: Nicholas Wallace, male    DOB: 05-25-1951, 61 y.o.   MRN: [id number]  HPI F/upfor pyelo, feels much better. No fever, nausea, and much less pain   Review of Systems     Objective:   Physical Exam  Constitutional: He is oriented to person, place, and time. He appears well-developed and well-nourished.  Cardiovascular: Normal rate.   Pulmonary/Chest: Effort normal.  Abdominal: Bowel sounds are normal. He exhibits no mass. There is tenderness.  Neurological: He is alert and oriented to person, place, and time.  Skin: Skin is warm and dry.  Psychiatric: He has a normal mood and affect.   Results for orders placed in visit on 07/11/12  POCT UA - MICROSCOPIC ONLY      Component Value Range   WBC, Ur, HPF, POC 5-10     RBC, urine, microscopic 5-8     Bacteria, U Microscopic negative     Mucus, UA positive     Epithelial cells, urine per micros 0-1     Crystals, Ur, HPF, POC negative     Casts, Ur, LPF, POC negative     Yeast, UA negative    POCT URINALYSIS DIPSTICK      Component Value Range   Color, UA dark yellow     Clarity, UA slightly cloudy     Glucose, UA negative     Bilirubin, UA negative     Ketones, UA negative     Spec Grav, UA >=1.030     Blood, UA negative     pH, UA 5.5     Protein, UA trace     Urobilinogen, UA 0.2     Nitrite, UA negative     Leukocytes, UA Negative            Assessment & Plan:  Pyelonephritis resolving Finish cipro Stop Nsaids

## 2012-07-12 ENCOUNTER — Telehealth: Payer: Self-pay

## 2012-07-12 LAB — URINE CULTURE

## 2012-07-12 NOTE — Telephone Encounter (Signed)
Patient no longer needs to speak to Dr Perrin Maltese, he just needs a note excusing him from work Tuesday-Friday and that he can return Monday if he is feeling better. This is all per Dr Ernestene Mention recommendation according to patient. He would like to pick it up tomorrow (Sat 07/13/12)

## 2012-07-12 NOTE — Telephone Encounter (Signed)
PT WOULD LIKE TO SPEAK WITH SOMEONE REGARDING HIS VISIT WITH DR Polly Cobia, WOULD PREFER TO SPEAK WITH HIM PERSONALLY PLEASE CALL [phone]

## 2012-07-13 NOTE — ED Notes (Signed)
+  Urine. Patient given Keflex. Resistant. Chart sent to EDP office for review. °

## 2012-07-13 NOTE — Telephone Encounter (Signed)
Will forward to Dr. Perrin Maltese as there is no mention of this in his note.

## 2012-07-14 ENCOUNTER — Telehealth: Payer: Self-pay | Admitting: Radiology

## 2012-07-14 ENCOUNTER — Telehealth (HOSPITAL_COMMUNITY): Payer: Self-pay | Admitting: Emergency Medicine

## 2012-07-14 NOTE — Telephone Encounter (Signed)
Taken care of

## 2012-07-14 NOTE — Telephone Encounter (Signed)
Patient requested work note for time missed for kidney stone, I have written this and put at front desk, he can pickup

## 2012-07-14 NOTE — ED Notes (Signed)
Attempted to contact patient by phone but numbers provided were wrong numbers.

## 2012-07-14 NOTE — ED Notes (Signed)
Chart returned from EDP office. Prescribed Cipro 500 mg PO BID x 7 days. Prescribed by Teressa Lower NP.

## 2012-07-14 NOTE — ED Notes (Signed)
Unable to contact patient via phone. Sent letter. °

## 2012-09-15 ENCOUNTER — Telehealth (HOSPITAL_COMMUNITY): Payer: Self-pay | Admitting: Emergency Medicine

## 2012-09-15 NOTE — ED Notes (Signed)
No response to letter sent after 30 days. Chart sent to Medical Records. °

## 2012-12-24 ENCOUNTER — Other Ambulatory Visit: Payer: Self-pay | Admitting: Emergency Medicine

## 2012-12-24 NOTE — Telephone Encounter (Signed)
Patient called asking for a refill on the following medication Lisinopril. He would like [place] to send it to [facility] located in [place]. Please call at [phone]. Thanks

## 2013-01-20 ENCOUNTER — Telehealth: Payer: Self-pay

## 2013-01-20 NOTE — Telephone Encounter (Signed)
Pt would like an Urgent refill on lisinopril, he will be going out of town tomorrow. Best# [phone] Pharmacy:[facility]

## 2013-01-21 MED ORDER — LISINOPRIL 20 MG PO TABS: 20.0000 mg | ORAL_TABLET | Freq: Every day | ORAL | Status: DC

## 2013-01-21 NOTE — Telephone Encounter (Signed)
lmom that rx was sent in

## 2013-01-21 NOTE — Telephone Encounter (Signed)
I have done the refill - he will need an OV before he gets more refills

## 2013-02-11 ENCOUNTER — Ambulatory Visit (INDEPENDENT_AMBULATORY_CARE_PROVIDER_SITE_OTHER): Payer: BC Managed Care – PPO | Admitting: Family Medicine

## 2013-02-11 VITALS — BP 132/84 | HR 70 | Temp 98.0°F | Resp 17 | Ht 69.5 in | Wt 238.0 lb

## 2013-02-11 DIAGNOSIS — E785 Hyperlipidemia, unspecified: Secondary | ICD-10-CM

## 2013-02-11 DIAGNOSIS — N529 Male erectile dysfunction, unspecified: Secondary | ICD-10-CM

## 2013-02-11 DIAGNOSIS — I10 Essential (primary) hypertension: Secondary | ICD-10-CM

## 2013-02-11 MED ORDER — ATORVASTATIN CALCIUM 40 MG PO TABS: 40.0000 mg | ORAL_TABLET | Freq: Every day | ORAL | Status: DC

## 2013-02-11 MED ORDER — TADALAFIL 20 MG PO TABS: 20.0000 mg | ORAL_TABLET | Freq: Every day | ORAL | Status: DC | PRN

## 2013-02-11 MED ORDER — LISINOPRIL 40 MG PO TABS: 40.0000 mg | ORAL_TABLET | Freq: Every day | ORAL | Status: DC

## 2013-02-11 NOTE — Patient Instructions (Addendum)

## 2013-02-11 NOTE — Progress Notes (Signed)
Patient ID: Nicholas Wallace MRN: [id number], DOB: 12-13-1950, 62 y.o. Date of Encounter: 02/11/2013, 7:52 PM  Primary Physician: Seymour Bars, DO  Chief Complaint: HTN  HPI: 62 y.o. year old male with history below presents for hypertension follow up.  [employer] for [employer].  Lately, BP has been running higher. No CP, HA, visual changes, or focal deficits.   Past Medical History  Diagnosis Date  . Abnormal EKG 02/19/2012  . Allergy   . Cancer   . Hypertension   . Blood transfusion without reported diagnosis      Home Meds: Prior to Admission medications   Medication Sig Start Date End Date Taking? Authorizing Provider  aspirin 81 MG tablet Take 81 mg by mouth daily.   Yes Historical Provider, MD  fluticasone (FLONASE) 50 MCG/ACT nasal spray Place 2 sprays into the nose daily. 06/17/12  Yes Sherren Mocha, MD  lisinopril (PRINIVIL,ZESTRIL) 20 MG tablet Take 1 tablet (20 mg total) by mouth daily. 01/21/13  Yes Morrell Riddle, PA-C  Multiple Vitamin (MULTIVITAMIN WITH MINERALS) TABS Take 1 tablet by mouth daily.   Yes Historical Provider, MD  tadalafil (CIALIS) 5 MG tablet Take 2.5 mg by mouth daily as needed. 03/18/12 04/17/13 Yes Elvina Sidle, MD  atorvastatin (LIPITOR) 40 MG tablet Take 1 tablet (40 mg total) by mouth at bedtime. 06/17/12   Sherren Mocha, MD  sildenafil (VIAGRA) 100 MG tablet Take 100 mg by mouth daily as needed. Erectile dysfuntion    Historical Provider, MD    Allergies: No Known Allergies  History   Social History  . Marital Status: Married    Spouse Name: N/A    Number of Children: N/A  . Years of Education: N/A   Occupational History  . Not on file.   Social History Main Topics  . Smoking status: Never Smoker   . Smokeless tobacco: Not on file  . Alcohol Use: Yes  . Drug Use: No  . Sexually Active: Yes    Birth Control/ Protection: None   Other Topics Concern  . Not on file   Social History Narrative  . No  narrative on file     Family History  Problem Relation Age of Onset  . Alzheimer's disease Mother   . Heart disease Maternal Grandmother   . Cancer Paternal Grandmother     Review of Systems: Constitutional: negative for chills, fever, night sweats, weight changes, or fatigue  HEENT: negative for vision changes, hearing loss, congestion, rhinorrhea, ST, epistaxis, or sinus pressure Cardiovascular: negative for chest pain, palpitations, or DOE Respiratory: negative for hemoptysis, wheezing, shortness of breath, or cough Abdominal: negative for abdominal pain, nausea, vomiting, diarrhea, or constipation Dermatological: negative for rash Neurologic: negative for headache, dizziness, or syncope All other systems reviewed and are otherwise negative with the exception to those above and in the HPI.   Physical Exam: Blood pressure 132/84, pulse 70, temperature 98 F (36.7 C), temperature source Oral, resp. rate 17, height 5' 9.5" (1.765 m), weight 238 lb (107.956 kg), SpO2 95.00%., Body mass index is 34.65 kg/(m^2). General: Well developed, well nourished, in no acute distress. Head: Normocephalic, atraumatic, eyes without discharge, sclera non-icteric, nares are without discharge. Bilateral auditory canals clear, TM's are without perforation, pearly grey and translucent with reflective cone of light bilaterally. Oral cavity moist, posterior pharynx without exudate, erythema, peritonsillar abscess, or post nasal drip.  Neck: Supple. No thyromegaly. Full ROM. No lymphadenopathy. No carotid bruits. Lungs: Clear  bilaterally to auscultation without wheezes, rales, or rhonchi. Breathing is unlabored. Heart: RRR with S1 S2. No murmurs, rubs, or gallops appreciated.  Abdomen: Soft, non-tender, non-distended with normoactive bowel sounds. No hepatosplenomegaly. No rebound/guarding. No obvious abdominal masses. Msk:  Strength and tone normal for age. Extremities/Skin: Warm and dry. No clubbing or  cyanosis. No edema. No rashes or suspicious lesions. Distal pulses 2+ and equal bilaterally. Neuro: Alert and oriented X 3. Moves all extremities spontaneously. Gait is normal. CNII-XII grossly in tact. DTR 2+, cerebellar function intact. Rhomberg normal. Psych:  Responds to questions appropriately with a normal affect.     ASSESSMENT AND PLAN:  62 y.o. year old male with  -Hypertension - Plan: lisinopril (PRINIVIL,ZESTRIL) 40 MG tablet, DISCONTINUED: lisinopril (PRINIVIL,ZESTRIL) 40 MG tablet  DYSLIPIDEMIA - Plan: atorvastatin (LIPITOR) 40 MG tablet, DISCONTINUED: atorvastatin (LIPITOR) 40 MG tablet  Erectile dysfunction - Plan: tadalafil (CIALIS) 20 MG tablet, DISCONTINUED: tadalafil (CIALIS) 20 MG tablet CPE in two weeks  Signed, Elvina Sidle, MD    Signed, Elvina Sidle, MD 02/11/2013 7:52 PM

## 2013-04-28 ENCOUNTER — Ambulatory Visit (INDEPENDENT_AMBULATORY_CARE_PROVIDER_SITE_OTHER): Payer: BC Managed Care – PPO | Admitting: Emergency Medicine

## 2013-04-28 VITALS — BP 118/76 | HR 66 | Temp 98.0°F | Resp 18 | Ht 69.5 in | Wt 233.4 lb

## 2013-04-28 DIAGNOSIS — E785 Hyperlipidemia, unspecified: Secondary | ICD-10-CM

## 2013-04-28 DIAGNOSIS — I10 Essential (primary) hypertension: Secondary | ICD-10-CM

## 2013-04-28 DIAGNOSIS — C61 Malignant neoplasm of prostate: Secondary | ICD-10-CM

## 2013-04-28 DIAGNOSIS — Z0000 Encounter for general adult medical examination without abnormal findings: Secondary | ICD-10-CM

## 2013-04-28 DIAGNOSIS — J329 Chronic sinusitis, unspecified: Secondary | ICD-10-CM

## 2013-04-28 DIAGNOSIS — Z23 Encounter for immunization: Secondary | ICD-10-CM

## 2013-04-28 DIAGNOSIS — R5383 Other fatigue: Secondary | ICD-10-CM

## 2013-04-28 DIAGNOSIS — N529 Male erectile dysfunction, unspecified: Secondary | ICD-10-CM

## 2013-04-28 DIAGNOSIS — R5381 Other malaise: Secondary | ICD-10-CM

## 2013-04-28 LAB — POCT CBC
Hemoglobin: 15.6 g/dL (ref 14.1–18.1)
MCH, POC: 31.9 pg — AB (ref 27–31.2)
MCV: 99.9 fL — AB (ref 80–97)
MPV: 8.8 fL (ref 0–99.8)
POC MID %: 7.1 %M (ref 0–12)
RBC: 4.89 M/uL (ref 4.69–6.13)
WBC: 7.4 10*3/uL (ref 4.6–10.2)

## 2013-04-28 LAB — POCT URINALYSIS DIPSTICK
Glucose, UA: NEGATIVE
Spec Grav, UA: >=1.03
Urobilinogen, UA: 0.2

## 2013-04-28 MED ORDER — ATORVASTATIN CALCIUM 40 MG PO TABS: 40.0000 mg | ORAL_TABLET | Freq: Every day | ORAL | Status: DC

## 2013-04-28 MED ORDER — FLUTICASONE PROPIONATE 50 MCG/ACT NA SUSP: 2.0000 | Freq: Every day | NASAL | Status: DC

## 2013-04-28 MED ORDER — LISINOPRIL 40 MG PO TABS: 40.0000 mg | ORAL_TABLET | Freq: Every day | ORAL | Status: DC

## 2013-04-28 MED ORDER — TETANUS-DIPHTH-ACELL PERTUSSIS 5-2.5-18.5 LF-MCG/0.5 IM SUSP: 0.5000 mL | Freq: Once | INTRAMUSCULAR | Status: DC

## 2013-04-28 MED ORDER — TADALAFIL 20 MG PO TABS: 20.0000 mg | ORAL_TABLET | Freq: Every day | ORAL | Status: DC | PRN

## 2013-04-28 NOTE — Progress Notes (Signed)
  Subjective:    Patient ID: Nicholas Wallace, male    DOB: 1950/12/16, 62 y.o.   MRN: [id number]  HPI Pt here for a complete physical pt has htn is taking Lipitor and atorvastatin.     Review of Systems  Constitutional: Positive for fatigue.  HENT: Positive for congestion and sneezing.   Eyes: Negative.   Respiratory: Negative.   Cardiovascular: Negative.   Gastrointestinal: Negative.   Endocrine: Negative.   Genitourinary: Negative.   Musculoskeletal: Positive for myalgias.  Skin: Negative.   Allergic/Immunologic: Negative.   Neurological: Negative.   Hematological: Negative.   Psychiatric/Behavioral: Negative.        Objective:   Physical Exam HEENT exam TMs are clear nose is congested. Throat is slightly red. Neck is supple. Chest is clear to both auscultation and percussion. Heart regular grade no murmurs rubs or gallops appreciated. Abdomen is soft nontender she is having normal male testicles descended no hernias felt. 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 Extremities are without cyanosis clubbing or edema.  EKG normal sinus rhythm sinus bradycardia      Assessment & Plan:     He is up-to-date on colonoscopy. Marland Kitchen He states he has had a shingles vaccine. No change in medications at present. He did have a cardiology followup last year. He also had his colon checked last year.

## 2013-04-29 LAB — LIPID PANEL
Cholesterol: 215 mg/dL — ABNORMAL HIGH (ref 0–200)
Total CHOL/HDL Ratio: 5.4 Ratio

## 2013-04-29 LAB — COMPREHENSIVE METABOLIC PANEL
Albumin: 4.4 g/dL (ref 3.5–5.2)
Alkaline Phosphatase: 56 U/L (ref 39–117)
BUN: 10 mg/dL (ref 6–23)
CO2: 28 mEq/L (ref 19–32)
Calcium: 9.6 mg/dL (ref 8.4–10.5)
Chloride: 105 mEq/L (ref 96–112)
Glucose, Bld: 92 mg/dL (ref 70–99)
Potassium: 4.2 mEq/L (ref 3.5–5.3)

## 2013-04-29 LAB — PSA: PSA: <0.01 ng/mL (ref <=4.00)

## 2013-04-29 LAB — TSH: TSH: 0.868 u[IU]/mL (ref 0.350–4.500)

## 2013-06-26 ENCOUNTER — Other Ambulatory Visit: Payer: Self-pay | Admitting: Emergency Medicine

## 2013-07-02 ENCOUNTER — Ambulatory Visit (INDEPENDENT_AMBULATORY_CARE_PROVIDER_SITE_OTHER): Payer: BC Managed Care – PPO | Admitting: Physician Assistant

## 2013-07-02 VITALS — BP 136/84 | HR 62 | Temp 98.7°F | Resp 14 | Ht 70.0 in | Wt 239.0 lb

## 2013-07-02 DIAGNOSIS — B029 Zoster without complications: Secondary | ICD-10-CM

## 2013-07-02 MED ORDER — VALACYCLOVIR HCL 1 G PO TABS: 1000.0000 mg | ORAL_TABLET | Freq: Three times a day (TID) | ORAL | Status: DC

## 2013-07-02 NOTE — Patient Instructions (Signed)
Shingles Shingles (herpes zoster) is an infection that is caused by the same virus that causes chickenpox (varicella). The infection causes a painful skin rash and fluid-filled blisters, which eventually break open, crust over, and heal. It may occur in any area of the body, but it usually affects only one side of the body or face. The pain of shingles usually lasts about 1 month. However, some people with shingles may develop long-term (chronic) pain in the affected area of the body. Shingles often occurs many years after the person had chickenpox. It is more common:  In people older than 50 years.  In people with weakened immune systems, such as those with HIV, AIDS, or cancer.  In people taking medicines that weaken the immune system, such as transplant medicines.  In people under great stress. CAUSES  Shingles is caused by the varicella zoster virus (VZV), which also causes chickenpox. After a person is infected with the virus, it can remain in the person's body for years in an inactive state (dormant). To cause shingles, the virus reactivates and breaks out as an infection in a nerve root. The virus can be spread from person to person (contagious) through contact with open blisters of the shingles rash. It will only spread to people who have not had chickenpox. When these people are exposed to the virus, they may develop chickenpox. They will not develop shingles. Once the blisters scab over, the person is no longer contagious and cannot spread the virus to others. SYMPTOMS  Shingles shows up in stages. The initial symptoms may be pain, itching, and tingling in an area of the skin. This pain is usually described as burning, stabbing, or throbbing.In a few days or weeks, a painful red rash will appear in the area where the pain, itching, and tingling were felt. The rash is usually on one side of the body in a band or belt-like pattern. Then, the rash usually turns into fluid-filled blisters. They  will scab over and dry up in approximately 2 3 weeks. Flu-like symptoms may also occur with the initial symptoms, the rash, or the blisters. These may include:  Fever.  Chills.  Headache.  Upset stomach. DIAGNOSIS  Your caregiver will perform a skin exam to diagnose shingles. Skin scrapings or fluid samples may also be taken from the blisters. This sample will be examined under a microscope or sent to a lab for further testing. TREATMENT  There is no specific cure for shingles. Your caregiver will likely prescribe medicines to help you manage the pain, recover faster, and avoid long-term problems. This may include antiviral drugs, anti-inflammatory drugs, and pain medicines. HOME CARE INSTRUCTIONS   Take a cool bath or apply cool compresses to the area of the rash or blisters as directed. This may help with the pain and itching.   Only take over-the-counter or prescription medicines as directed by your caregiver.   Rest as directed by your caregiver.  Keep your rash and blisters clean with mild soap and cool water or as directed by your caregiver.  Do not pick your blisters or scratch your rash. Apply an anti-itch cream or numbing creams to the affected area as directed by your caregiver.  Keep your shingles rash covered with a loose bandage (dressing).  Avoid skin contact with:  Babies.   Pregnant women.   Children with eczema.   Elderly people with transplants.   People with chronic illnesses, such as leukemia or AIDS.   Wear loose-fitting clothing to help ease   the pain of material rubbing against the rash.  Keep all follow-up appointments with your caregiver.If the area involved is on your face, you may receive a referral for follow-up to a specialist, such as an eye doctor (ophthalmologist) or an ear, nose, and throat (ENT) doctor. Keeping all follow-up appointments will help you avoid eye complications, chronic pain, or disability.  SEEK IMMEDIATE MEDICAL  CARE IF:   You have facial pain, pain around the eye area, or loss of feeling on one side of your face.  You have ear pain or ringing in your ear.  You have loss of taste.  Your pain is not relieved with prescribed medicines.   Your redness or swelling spreads.   You have more pain and swelling.  Your condition is worsening or has changed.   You have a feveror persistent symptoms for more than 2 3 days.  You have a fever and your symptoms suddenly get worse. MAKE SURE YOU:  Understand these instructions.  Will watch your condition.  Will get help right away if you are not doing well or get worse. Document Released: 07/24/2005 Document Revised: 04/17/2012 Document Reviewed: 03/07/2012 Exit[employer] Patient Information 2014 [employer].  

## 2013-07-02 NOTE — Progress Notes (Signed)
Patient ID: Nicholas Wallace MRN: [id number], DOB: 01-Feb-1951, 62 y.o. Date of Encounter: 07/02/2013, 7:25 PM  Primary Physician: Seymour Bars, DO  Chief Complaint: Rash   HPI: 62 y.o. male with history below presents with rash along the left clavicle for 5 days. Rash initially began as pruritic and "stingy." He applied Benadryl cream and cortisone cream without any relief to the rash. Rash has not increased in size or decreased in size. He did receive the shingles vaccine a year or two ago, but believes this may be shingles. Afebrile. No chills. No recent URI. Feels well otherwise.    Past Medical History  Diagnosis Date  . Abnormal EKG 02/19/2012  . Allergy   . Cancer   . Hypertension   . Blood transfusion without reported diagnosis      Home Meds: Prior to Admission medications   Medication Sig Start Date End Date Taking? Authorizing Provider  aspirin 81 MG tablet Take 81 mg by mouth daily.   Yes Historical Provider, MD  atorvastatin (LIPITOR) 40 MG tablet Take 1 tablet (40 mg total) by mouth at bedtime. 04/28/13  Yes Collene Gobble, MD  fluticasone (FLONASE) 50 MCG/ACT nasal spray Place 2 sprays into the nose daily. 04/28/13  Yes Collene Gobble, MD  lisinopril (PRINIVIL,ZESTRIL) 40 MG tablet Take 1 tablet (40 mg total) by mouth daily. 04/28/13  Yes Collene Gobble, MD  Multiple Vitamin (MULTIVITAMIN WITH MINERALS) TABS Take 1 tablet by mouth daily.   Yes Historical Provider, MD  tadalafil (CIALIS) 20 MG tablet Take 1 tablet (20 mg total) by mouth daily as needed for erectile dysfunction. 04/28/13  Yes Collene Gobble, MD           Allergies: No Known Allergies  History   Social History  . Marital Status: Married    Spouse Name: N/A    Number of Children: N/A  . Years of Education: N/A   Occupational History  . Not on file.   Social History Main Topics  . Smoking status: Never Smoker   . Smokeless tobacco: Not on file  . Alcohol Use: Yes  . Drug Use: No  . Sexual  Activity: Yes    Birth Control/ Protection: None   Other Topics Concern  . Not on file   Social History Narrative  . No narrative on file     Review of Systems: Constitutional: negative for chills, fever, or fatigue  Dermatological: positive for rash   Physical Exam: Blood pressure 136/84, pulse 62, temperature 98.7 F (37.1 C), resp. rate 14, height 5\' 10"  (1.778 m), weight 239 lb (108.41 kg), SpO2 97.00%., Body mass index is 34.29 kg/(m^2). General: Well developed, well nourished, in no acute distress. Head: Normocephalic, atraumatic, eyes without discharge, sclera non-icteric, nares are without discharge.   Neck: Supple. Full ROM.  Lungs: Clear bilaterally to auscultation without wheezes, rales, or rhonchi. Breathing is unlabored. Heart: RRR with S1 S2. No murmurs, rubs, or gallops appreciated. Msk:  Strength and tone normal for age. Extremities/Skin: 2 cm x 1 cm vesicular rash along the left clavicle consistent with shingles. No secondary infection present.  Neuro: Alert and oriented X 3. Moves all extremities spontaneously. Gait is normal. CNII-XII grossly in tact. Psych:  Responds to questions appropriately with a normal affect.     ASSESSMENT AND PLAN:  62 y.o. male with shingles -Valtrex 1000 mg 1 po tid #21 RF 0 -Education provided  -RTC prn   Signed, Eula Listen, PA-C [facility] [place], [place] [address] [phone] 07/02/2013 7:25 PM

## 2013-08-12 NOTE — Progress Notes (Unsigned)
PA approved for cialis 20 mg through 08/06/2038. Notified pharm.

## 2013-12-09 ENCOUNTER — Telehealth: Payer: Self-pay

## 2013-12-09 IMAGING — CT CT ABD-PELV W/O CM
1 series · 14 of 26 positions shown, 18 images · non-contrast
Comparison: CT of the abdomen and pelvis performed 09/17/2003

CLINICAL DATA: Abdominal pain; right flank pain.  History of renal
stones.  Hematuria and fever.

CT ABDOMEN AND PELVIS WITHOUT CONTRAST
TECHNIQUE: Multidetector CT imaging of the abdomen and pelvis was
performed following the standard protocol without intravenous
contrast.

[Series 6: lung · axial · 0.82mm/px · z∈[-198,-88]mm · 14 of 26 slices shown, 18 images]
[im 3/26  soft-tissue]
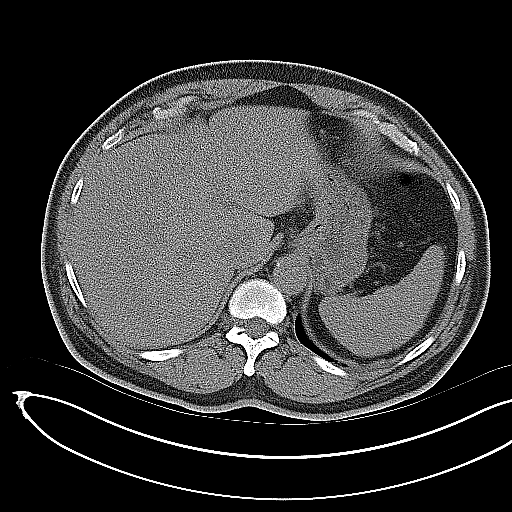
[im 3/26  bone]
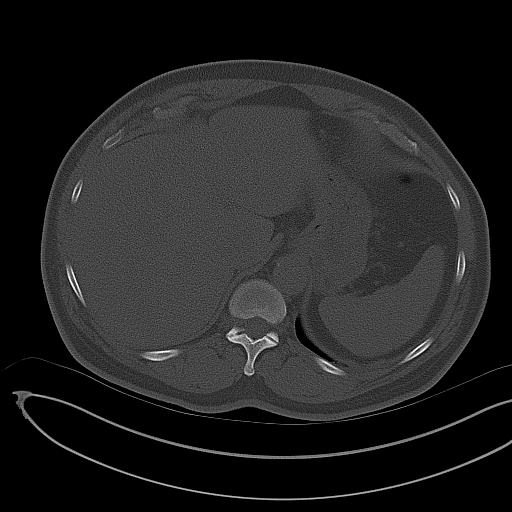
[im 5/26  soft-tissue]
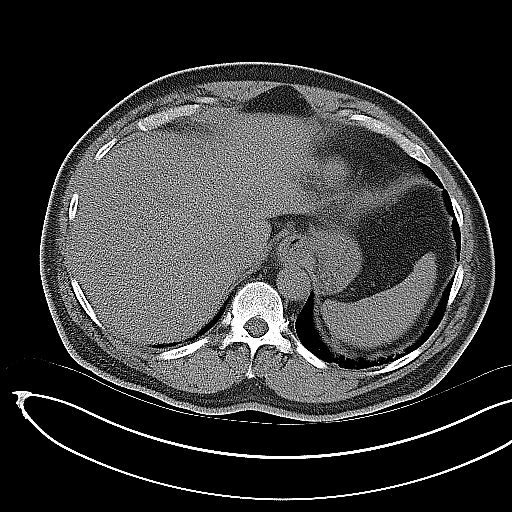
[im 7/26  soft-tissue]
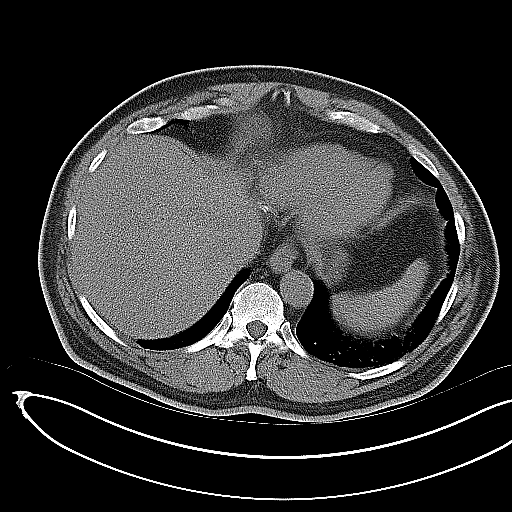
[im 9/26  soft-tissue]
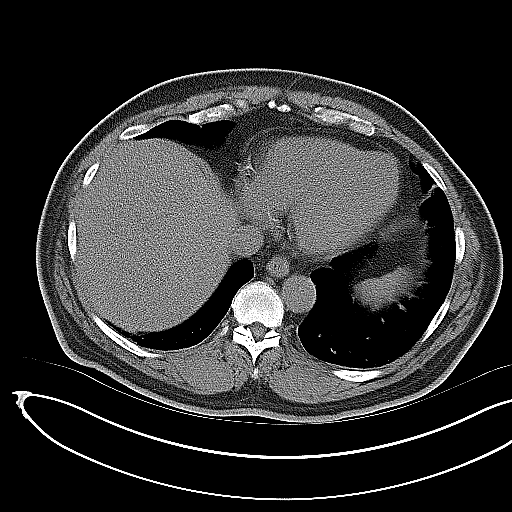
[im 11/26  soft-tissue]
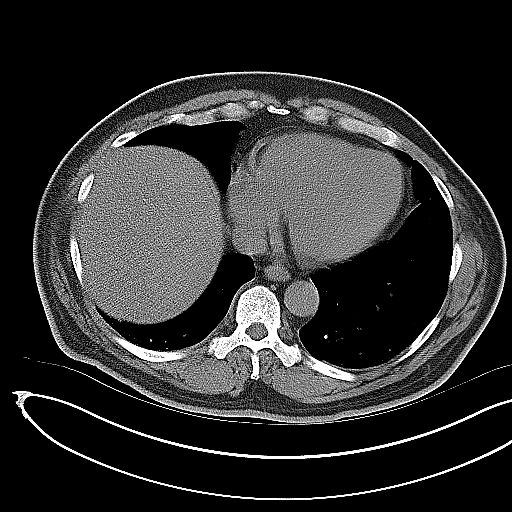
[im 13/26  soft-tissue]
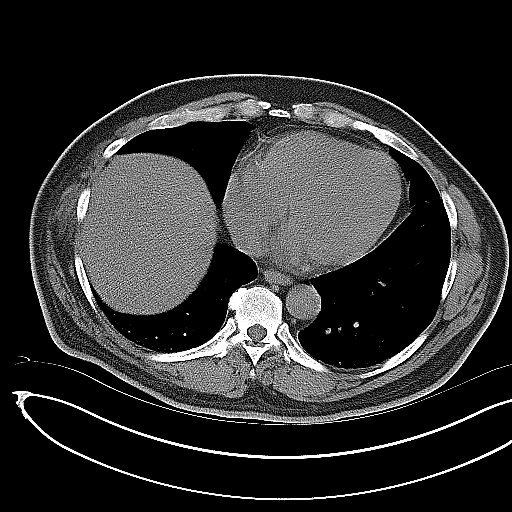
[im 15/26  soft-tissue]
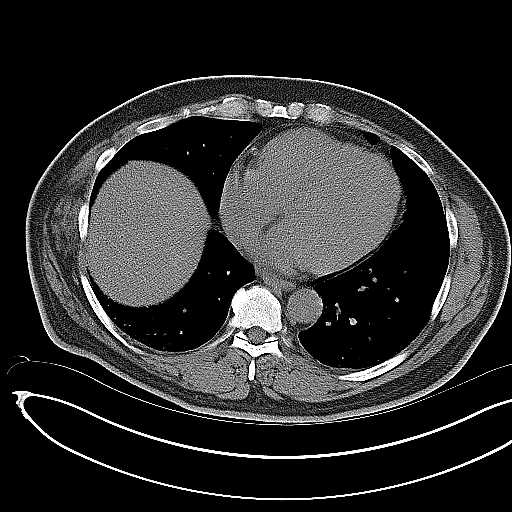
[im 17/26  soft-tissue]
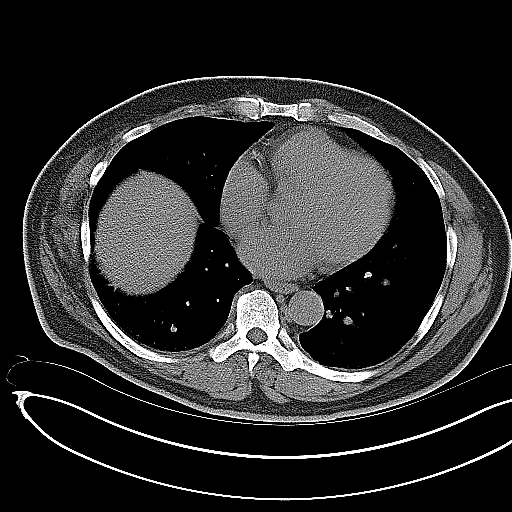
[im 19/26  soft-tissue]
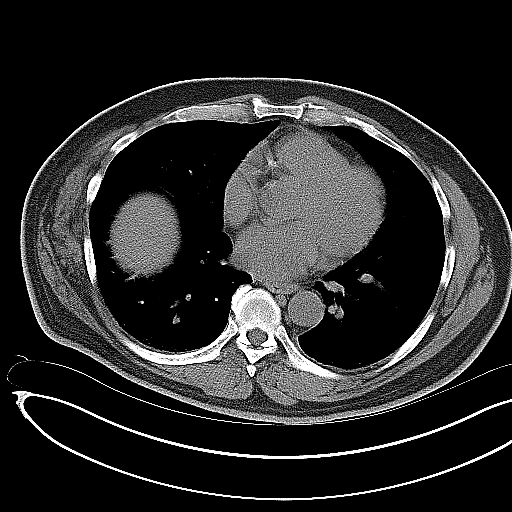
[im 19/26  bone]
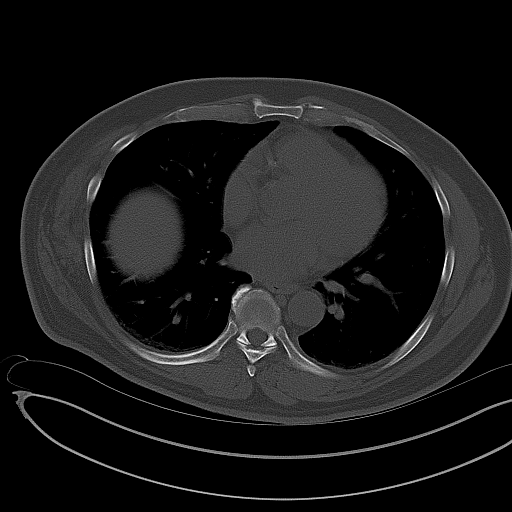
[im 21/26  soft-tissue]
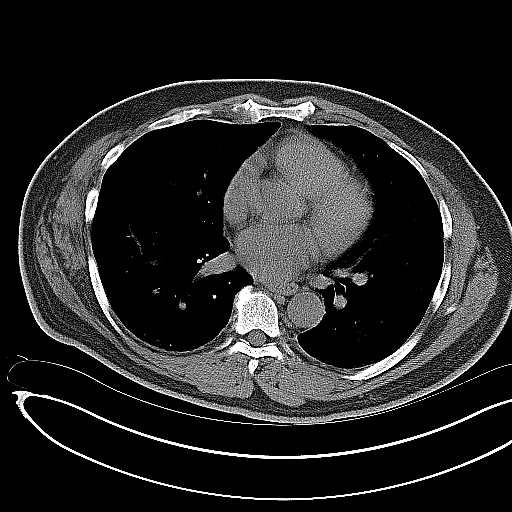
[im 22/26  lung]
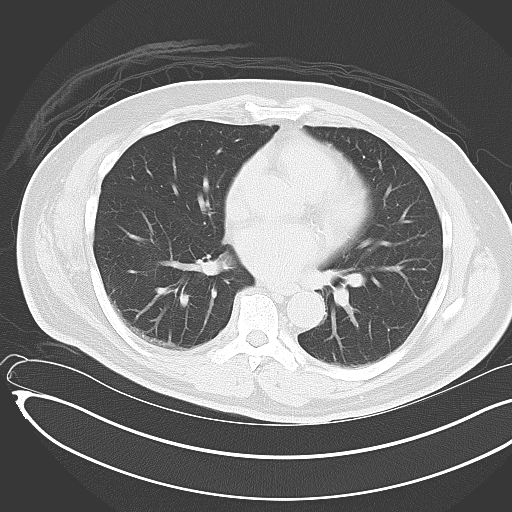
[im 23/26  soft-tissue]
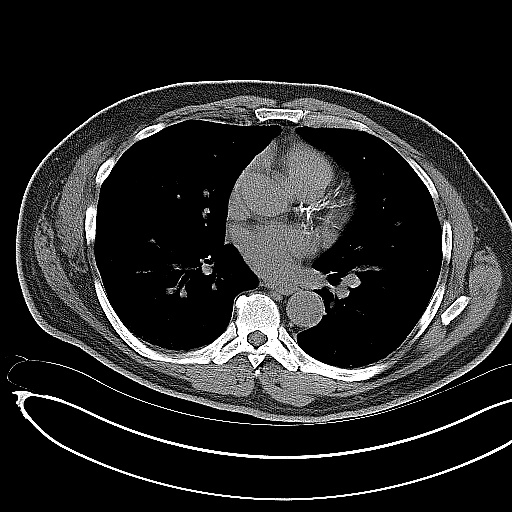
[im 23/26  lung]
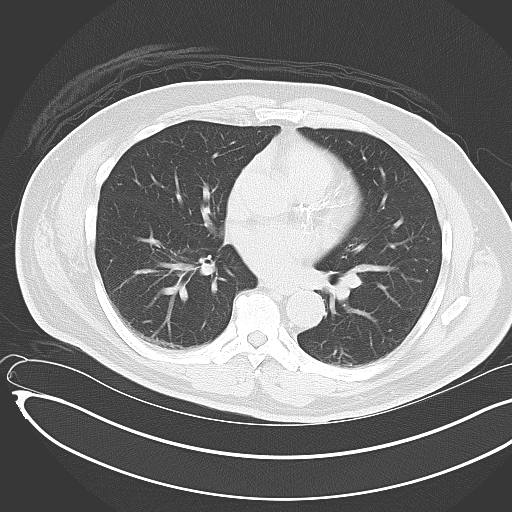
[im 24/26  lung]
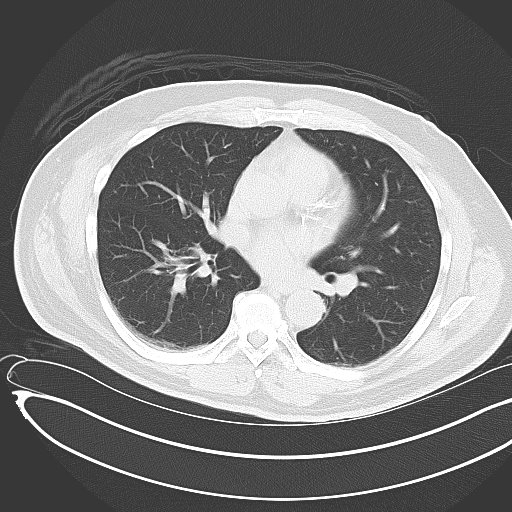
[im 25/26  soft-tissue]
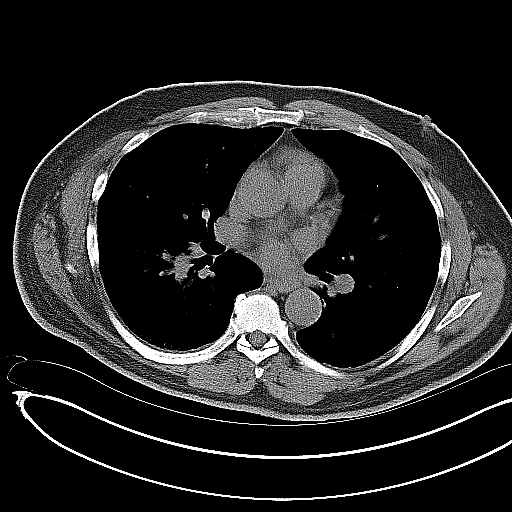
[im 25/26  lung]
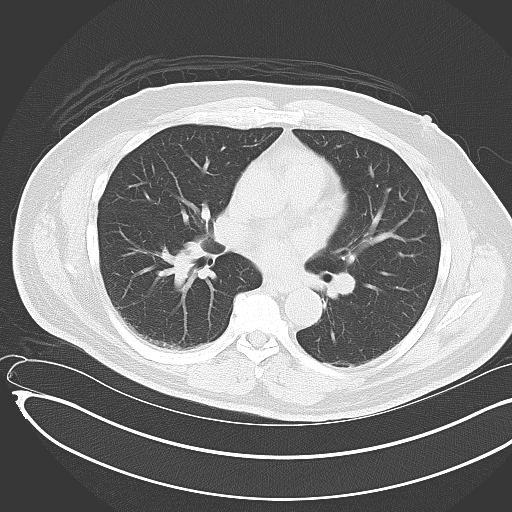

[14 of 26 positions shown; findings below may reference images not displayed]

FINDINGS: Minimal bibasilar atelectasis is noted.  Diffuse coronary
artery calcifications are seen.

The liver and spleen are unremarkable in appearance.  The
gallbladder is within normal limits.  The pancreas and adrenal
glands are unremarkable.

There is mildly increased bilateral perinephric stranding, slightly
more prominent on the left.  On the left side, prominence of the
extrarenal pelvis now demonstrates slight apparent wall thickening,
raising question for ureteritis.  There is no definite evidence of
hydronephrosis.  A tiny 2 mm stone is noted at the lower pole of
the left kidney.

No free fluid is identified.  The small bowel is unremarkable in
appearance.  The stomach is within normal limits.  No acute
vascular abnormalities are seen.  Minimal scattered calcification
is noted along the abdominal aorta and its branches.

The appendix is normal in caliber, without evidence for
appendicitis.  Scattered diverticulosis is noted along the
descending and proximal sigmoid colon, without evidence of
diverticulitis.  Apparent minimal wall thickening along the rectum
is thought to reflect intraluminal contents.

The bladder is mildly distended and grossly unremarkable in
appearance.  The patient is status post prostatectomy.  No inguinal
lymphadenopathy is seen.

No acute osseous abnormalities are identified.  There is mild
narrowing of the intervertebral disc space at L5-S1.
IMPRESSION: 1.  Mildly increased bilateral perinephric stranding, slightly more
prominent on the left.  Slight apparent wall thickening noted along
the left extrarenal pelvis, raising question for ureteritis.
Underlying pyelonephritis cannot be excluded.
2.  No evidence of hydronephrosis; 2 mm nonobstructing stone
incidentally noted at the lower pole of the left kidney.
3.  Scattered diverticulosis noted along the descending and
proximal sigmoid colon, without evidence of diverticulitis.

4.  Diffuse coronary artery calcifications seen.

## 2013-12-09 NOTE — Telephone Encounter (Signed)
Patient called stated he want to know when his last Routine Physical was done.  [phone]

## 2013-12-09 NOTE — Telephone Encounter (Signed)
LMVM for patient to CB.   (cpe was done 01/05/2012 by Dr. Everlene Farrier)

## 2013-12-10 NOTE — Telephone Encounter (Signed)
Spoke to patient.  Advised that last PE was done by Dr. Everlene Farrier on 01/05/12.  He stated that he will come in for another PE when Dr. Everlene Farrier gets back.

## 2014-01-03 ENCOUNTER — Ambulatory Visit (INDEPENDENT_AMBULATORY_CARE_PROVIDER_SITE_OTHER): Payer: BC Managed Care – PPO | Admitting: Emergency Medicine

## 2014-01-03 VITALS — BP 144/88 | HR 53 | Temp 97.8°F | Resp 16 | Ht 70.0 in | Wt 233.6 lb

## 2014-01-03 DIAGNOSIS — L723 Sebaceous cyst: Secondary | ICD-10-CM

## 2014-01-03 DIAGNOSIS — L089 Local infection of the skin and subcutaneous tissue, unspecified: Secondary | ICD-10-CM

## 2014-01-03 DIAGNOSIS — I10 Essential (primary) hypertension: Secondary | ICD-10-CM

## 2014-01-03 MED ORDER — LISINOPRIL 40 MG PO TABS: 40.0000 mg | ORAL_TABLET | Freq: Every day | ORAL | Status: DC

## 2014-01-03 MED ORDER — DOXYCYCLINE HYCLATE 100 MG PO CAPS: 100.0000 mg | ORAL_CAPSULE | Freq: Two times a day (BID) | ORAL | Status: DC

## 2014-01-03 NOTE — Progress Notes (Signed)
   Subjective:    Patient ID: Nicholas Wallace, male    DOB: 08/19/1950, 63 y.o.   MRN: [id number]   PCP: Jenny Reichmann, MD  Chief Complaint  Patient presents with  . Insect Bite    Medications, allergies, past medical history, surgical history, family history, social history and problem list reviewed and updated.  Prior to Admission medications   Medication Sig Start Date End Date Taking? Authorizing Provider  aspirin 81 MG tablet Take 81 mg by mouth daily.   Yes Historical Provider, MD  atorvastatin (LIPITOR) 40 MG tablet Take 1 tablet (40 mg total) by mouth at bedtime. 04/28/13  Yes Darlyne Russian, MD  fluticasone (FLONASE) 50 MCG/ACT nasal spray Place 2 sprays into the nose daily. 04/28/13  Yes Darlyne Russian, MD  lisinopril (PRINIVIL,ZESTRIL) 40 MG tablet Take 1 tablet (40 mg total) by mouth daily. 04/28/13  Yes Darlyne Russian, MD  Multiple Vitamin (MULTIVITAMIN WITH MINERALS) TABS Take 1 tablet by mouth daily.   Yes Historical Provider, MD  tadalafil (CIALIS) 20 MG tablet Take 1 tablet (20 mg total) by mouth daily as needed for erectile dysfunction. 04/28/13   Darlyne Russian, MD    HPI  Presents with about 6 days of a tender lump on his back.  There is a tiny black dot in the center, so he thinks it's a bug bite, though he does not recall a bite. Does not recall a lump there previously.  Feels a little generalized malaise.  No fever, chills.  Review of Systems     Objective:   Physical Exam  Constitutional: He is oriented to person, place, and time. He appears well-developed and well-nourished. He is active and cooperative. No distress.    BP 144/88  Pulse 53  Temp(Src) 97.8 F (36.6 C) (Oral)  Resp 16  Ht 5\' 10"  (1.778 m)  Wt 233 lb 9.6 oz (105.96 kg)  BMI 33.52 kg/m2  SpO2 98%   Eyes: No scleral icterus.  Pulmonary/Chest: Effort normal.  Neurological: He is alert and oriented to person, place, and time.  Skin: Skin is warm and dry.  Psychiatric: He has a normal mood  and affect. His behavior is normal.          Assessment & Plan:  1. Infected sebaceous cyst of skin Warm compresses.  Anticipatory guidance provided.  Expect resolution of the cellulitis, and residual cyst, which can be observed or excised at his leisure.  If the pain/swelling worsens/persists, RTC for I&D. - doxycycline (VIBRAMYCIN) 100 MG capsule; Take 1 capsule (100 mg total) by mouth 2 (two) times daily.  Dispense: 20 capsule; Refill: 0  2. Hypertension Needs RF.  Will schedule CPE with PCP at his convenience. Last CPE was 04/28/2013, but his insurance year starts over 01/05/2014. - lisinopril (PRINIVIL,ZESTRIL) 40 MG tablet; Take 1 tablet (40 mg total) by mouth daily.  Dispense: 90 tablet; Refill: 3  This patient was also examined by Dr. Everlene Farrier, who developed the assessment and treatment plan.  Fara Chute, PA-C Physician Assistant-Certified [facility]

## 2014-01-03 NOTE — Patient Instructions (Signed)
Apply a warm compress to the area for 15-20 minutes 2-3 times daily. If the pain worsens or persists, return to the office, as the area may need to be drained.

## 2014-01-05 ENCOUNTER — Other Ambulatory Visit: Payer: Self-pay | Admitting: Emergency Medicine

## 2014-01-05 ENCOUNTER — Ambulatory Visit: Payer: BC Managed Care – PPO

## 2014-01-05 ENCOUNTER — Ambulatory Visit (INDEPENDENT_AMBULATORY_CARE_PROVIDER_SITE_OTHER): Payer: BLUE CROSS/BLUE SHIELD | Admitting: Emergency Medicine

## 2014-01-05 VITALS — BP 132/82 | HR 72 | Temp 98.8°F | Resp 18 | Ht 70.0 in | Wt 229.8 lb

## 2014-01-05 DIAGNOSIS — I7090 Unspecified atherosclerosis: Secondary | ICD-10-CM

## 2014-01-05 DIAGNOSIS — M545 Low back pain, unspecified: Secondary | ICD-10-CM

## 2014-01-05 DIAGNOSIS — M549 Dorsalgia, unspecified: Secondary | ICD-10-CM

## 2014-01-05 DIAGNOSIS — IMO0002 Reserved for concepts with insufficient information to code with codable children: Secondary | ICD-10-CM | POA: Diagnosis not present

## 2014-01-05 MED ORDER — HYDROCODONE-ACETAMINOPHEN 5-325 MG PO TABS
1.0000 | ORAL_TABLET | Freq: Four times a day (QID) | ORAL | Status: DC | PRN
Start: 2014-01-05 — End: 2014-04-20

## 2014-01-05 MED ORDER — PREDNISONE 20 MG PO TABS: ORAL_TABLET | ORAL | Status: DC

## 2014-01-05 NOTE — Addendum Note (Signed)
Addended by: Arlyss Queen A on: 01/05/2014 01:52 PM   Modules accepted: Orders

## 2014-01-05 NOTE — Patient Instructions (Signed)
Back Pain, Adult Low back pain is very common. About 1 in 5 people have back pain.The cause of low back pain is rarely dangerous. The pain often gets better over time.About half of people with a sudden onset of back pain feel better in just 2 weeks. About 8 in 10 people feel better by 6 weeks.  CAUSES Some common causes of back pain include:  Strain of the muscles or ligaments supporting the spine.  Wear and tear (degeneration) of the spinal discs.  Arthritis.  Direct injury to the back. DIAGNOSIS Most of the time, the direct cause of low back pain is not known.However, back pain can be treated effectively even when the exact cause of the pain is unknown.Answering your caregiver's questions about your overall health and symptoms is one of the most accurate ways to make sure the cause of your pain is not dangerous. If your caregiver needs more information, he or she may order lab work or imaging tests (X-rays or MRIs).However, even if imaging tests show changes in your back, this usually does not require surgery. HOME CARE INSTRUCTIONS For many people, back pain returns.Since low back pain is rarely dangerous, it is often a condition that people can learn to manageon their own.   Remain active. It is stressful on the back to sit or stand in one place. Do not sit, drive, or stand in one place for more than 30 minutes at a time. Take short walks on level surfaces as soon as pain allows.Try to increase the length of time you walk each day.  Do not stay in bed.Resting more than 1 or 2 days can delay your recovery.  Do not avoid exercise or work.Your body is made to move.It is not dangerous to be active, even though your back may hurt.Your back will likely heal faster if you return to being active before your pain is gone.  Pay attention to your body when you bend and lift. Many people have less discomfortwhen lifting if they bend their knees, keep the load close to their bodies,and  avoid twisting. Often, the most comfortable positions are those that put less stress on your recovering back.  Find a comfortable position to sleep. Use a firm mattress and lie on your side with your knees slightly bent. If you lie on your back, put a pillow under your knees.  Only take over-the-counter or prescription medicines as directed by your caregiver. Over-the-counter medicines to reduce pain and inflammation are often the most helpful.Your caregiver may prescribe muscle relaxant drugs.These medicines help dull your pain so you can more quickly return to your normal activities and healthy exercise.  Put ice on the injured area.  Put ice in a plastic bag.  Place a towel between your skin and the bag.  Leave the ice on for 15-20 minutes, 03-04 times a day for the first 2 to 3 days. After that, ice and heat may be alternated to reduce pain and spasms.  Ask your caregiver about trying back exercises and gentle massage. This may be of some benefit.  Avoid feeling anxious or stressed.Stress increases muscle tension and can worsen back pain.It is important to recognize when you are anxious or stressed and learn ways to manage it.Exercise is a great option. SEEK MEDICAL CARE IF:  You have pain that is not relieved with rest or medicine.  You have pain that does not improve in 1 week.  You have new symptoms.  You are generally not feeling well. SEEK   IMMEDIATE MEDICAL CARE IF:   You have pain that radiates from your back into your legs.  You develop new bowel or bladder control problems.  You have unusual weakness or numbness in your arms or legs.  You develop nausea or vomiting.  You develop abdominal pain.  You feel faint. Document Released: 07/24/2005 Document Revised: 01/23/2012 Document Reviewed: 12/12/2010 Exit[employer] Patient Information 2014 [employer].  

## 2014-01-05 NOTE — Progress Notes (Signed)
This chart was scribed for Darlyne Russian, MD by Einar Pheasant, ED Scribe. This patient was seen in room 14 and the patient's care was started at 8:58 AM.  Subjective:    Patient ID: Nicholas Wallace, male    DOB: 1951-03-07, 63 y.o.   MRN: [id number]  Chief Complaint  Patient presents with  . Back Pain    states that he was seen sat for cyst and pain has since gotten worse .    HPI HPI Comments: Nicholas Wallace is a 63 y.o. male who presents to the [facility] complaining of worsening lower back pain that started approximately 1 day ago. Pt states that 2 days ago she was seen for a cyst. He states that the area is less tender and there redness is reducing. He states that yesterday when he was about to go to bed he started experiencing lower back pain. However, the pain worsened this morning. Pt has a history of a back surgery on a buldging disc at [facility] in 1985. Nicholas Wallace also had an additional back surgery in 2000 on his upper back here in [place]. He denies any recent injuries or trauma to the area. He denies any dysuria, frequency, bladder or bowel incontinence.  Patient Active Problem List   Diagnosis Date Noted  . Abnormal EKG   . MALIGNANT NEOPLASM OF PROSTATE 06/04/2008  . DYSLIPIDEMIA 06/04/2008  . ESSENTIAL HYPERTENSION, BENIGN 06/04/2008   Past Medical History  Diagnosis Date  . Abnormal EKG 02/19/2012  . Allergy   . Cancer   . Hypertension   . Blood transfusion without reported diagnosis    Past Surgical History  Procedure Laterality Date  . Back surgery  1985    [facility]  . Prostatectomy    . Lymphadenectomy  2009    pelvic lymphadenectomy by Dr. Irine Seal  . Lumbar laminectomy    . Cervical kidney stones     No Known Allergies Prior to Admission medications   Medication Sig Start Date End Date Taking? Authorizing Provider  aspirin 81 MG tablet Take 81 mg by mouth daily.   Yes Historical Provider, MD  atorvastatin (LIPITOR) 40 MG tablet  Take 1 tablet (40 mg total) by mouth at bedtime. 04/28/13  Yes Darlyne Russian, MD  doxycycline (VIBRAMYCIN) 100 MG capsule Take 1 capsule (100 mg total) by mouth 2 (two) times daily. 01/03/14  Yes Chelle S Jeffery, PA-C  fluticasone (FLONASE) 50 MCG/ACT nasal spray Place 2 sprays into the nose daily. 04/28/13  Yes Darlyne Russian, MD  lisinopril (PRINIVIL,ZESTRIL) 40 MG tablet Take 1 tablet (40 mg total) by mouth daily. 01/03/14  Yes Chelle S Jeffery, PA-C  Multiple Vitamin (MULTIVITAMIN WITH MINERALS) TABS Take 1 tablet by mouth daily.   Yes Historical Provider, MD  tadalafil (CIALIS) 20 MG tablet Take 1 tablet (20 mg total) by mouth daily as needed for erectile dysfunction. 04/28/13  Yes Darlyne Russian, MD   History   Social History  . Marital Status: Married    Spouse Name: Nicholas Wallace    Number of Children: 6  . 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 Smoking status: Never Smoker   . Smokeless tobacco: Never Used  . Alcohol Use: 0.0 - 0.5 oz/week    0-1 drink(s) per week  . Drug Use: No  . Sexual Activity: Yes  Birth Control/ Protection: None   Other Topics Concern  . Not on file   Social History Narrative   Lives with his wife and their daughter.  They each have children from previous relationships.   Review of Systems  Constitutional: Negative for fatigue and unexpected weight change.  HENT: Negative for congestion and rhinorrhea.   Eyes: Negative for visual disturbance.  Respiratory: Negative for cough, chest tightness and shortness of breath.   Cardiovascular: Negative for palpitations and leg swelling.  Gastrointestinal: Negative for blood in stool.  Musculoskeletal: Positive for back pain.  Neurological: Negative for dizziness and light-headedness.   Objective:   Physical Exam CONSTITUTIONAL: Well developed/well nourished HEAD: Normocephalic/atraumatic EYES:  EOMI/PERRL ENMT: Mucous membranes moist NECK: supple no meningeal signs SPINE: Scar over the C-spine noted. Straight leg raises positive at 30 degress bilaterally. No focal weakness of the lower extremities. Pain with any movement of the lumbar spine.  CV: S1/S2 noted, no murmurs/rubs/gallops noted LUNGS: Lungs are clear to auscultation bilaterally, no apparent distress ABDOMEN: soft, nontender, no rebound or guarding GU:no cva tenderness NEURO: Pt is awake/alert, moves all extremitiesx4 EXTREMITIES: pulses normal, full ROM SKIN: warm, color normal; He has a 1x 2 cm healing cystic area over lateral L-3 on the left.  PSYCH: no abnormalities of mood noted [facility] reading (PRIMARY) by  Dr. Everlene Farrier patient has degenerative disc disease at L2-L3 and L5-S1  Assessment & Plan:  Patient presents with degenerative disease. We'll treat with prednisone in tapered dose along with hydrocodone for pain. If he persists in having discomfort will need MRI. I personally performed the services described in this documentation, which was scribed in my presence. The recorded information has been reviewed and is accurate.

## 2014-01-06 ENCOUNTER — Telehealth: Payer: Self-pay

## 2014-01-06 ENCOUNTER — Ambulatory Visit
Admission: RE | Admit: 2014-01-06 | Discharge: 2014-01-06 | Disposition: A | Discharge status: Home / Self Care | Payer: BC Managed Care – PPO | Source: Ambulatory Visit | Attending: Emergency Medicine | Admitting: Emergency Medicine

## 2014-01-06 ENCOUNTER — Other Ambulatory Visit: Payer: Self-pay | Admitting: Emergency Medicine

## 2014-01-06 DIAGNOSIS — I7090 Unspecified atherosclerosis: Secondary | ICD-10-CM

## 2014-01-06 DIAGNOSIS — M545 Low back pain, unspecified: Secondary | ICD-10-CM

## 2014-01-06 NOTE — Telephone Encounter (Signed)
Please call patient. His ultrasound did not show any signs of an aneurysm.

## 2014-01-06 NOTE — Telephone Encounter (Signed)
Left message on machine to call back  

## 2014-01-06 NOTE — Telephone Encounter (Signed)
Dr. Everlene Farrier    Please call patient regarding his ultrasound done today compared to the x-rays done here.   [phone]

## 2014-01-06 NOTE — Telephone Encounter (Signed)
Pt.notified

## 2014-02-09 ENCOUNTER — Telehealth: Payer: Self-pay

## 2014-02-09 NOTE — Telephone Encounter (Signed)
PA needed for cialis. Completed form on covermymeds.

## 2014-02-11 NOTE — Telephone Encounter (Signed)
PA approved through 08/06/2038. Notified pharm. 

## 2014-04-15 ENCOUNTER — Telehealth: Payer: Self-pay

## 2014-04-15 MED ORDER — LISINOPRIL 40 MG PO TABS: 40.0000 mg | ORAL_TABLET | Freq: Every day | ORAL | Status: DC

## 2014-04-15 NOTE — Telephone Encounter (Signed)
Sent in RFs and notified pt. Advised he can have RFs transferred in future if he has RFs on file anywhere. Pt agreed and I updated pharmacies in his demographics.

## 2014-04-15 NOTE — Telephone Encounter (Signed)
Refill onlisinopril (PRINIVIL,ZESTRIL) 40 MG tablet [[id number]] [facility] [address]

## 2014-04-20 ENCOUNTER — Ambulatory Visit (INDEPENDENT_AMBULATORY_CARE_PROVIDER_SITE_OTHER): Payer: BC Managed Care – PPO | Admitting: Emergency Medicine

## 2014-04-20 ENCOUNTER — Ambulatory Visit (INDEPENDENT_AMBULATORY_CARE_PROVIDER_SITE_OTHER): Payer: BC Managed Care – PPO

## 2014-04-20 VITALS — BP 138/84 | HR 80 | Temp 98.2°F | Resp 18 | Ht 70.0 in | Wt 231.8 lb

## 2014-04-20 DIAGNOSIS — J3089 Other allergic rhinitis: Secondary | ICD-10-CM

## 2014-04-20 DIAGNOSIS — J302 Other seasonal allergic rhinitis: Secondary | ICD-10-CM

## 2014-04-20 DIAGNOSIS — J029 Acute pharyngitis, unspecified: Secondary | ICD-10-CM

## 2014-04-20 LAB — POCT CBC
GRANULOCYTE PERCENT: 71.7 % (ref 37–80)
HCT, POC: 46.9 % (ref 43.5–53.7)
Hemoglobin: 15.6 g/dL (ref 14.1–18.1)
Lymph, poc: 2.3 (ref 0.6–3.4)
MCH: 31.5 pg — AB (ref 27–31.2)
MCHC: 33.4 g/dL (ref 31.8–35.4)
MCV: 94.5 fL (ref 80–97)
MID (cbc): 8.1 — AB (ref 0–0.9)
MPV: 7.2 fL (ref 0–99.8)
PLATELET COUNT, POC: 187 10*3/uL (ref 142–424)
POC Granulocyte: 8.1 — AB (ref 2–6.9)
POC LYMPH %: 20.7 % (ref 10–50)
POC MID %: 7.6 %M (ref 0–12)
RBC: 4.96 M/uL (ref 4.69–6.13)
RDW, POC: 13.7 %
WBC: 11.3 10*3/uL — AB (ref 4.6–10.2)

## 2014-04-20 LAB — POCT RAPID STREP A (OFFICE): Rapid Strep A Screen: NEGATIVE

## 2014-04-20 MED ORDER — AMOXICILLIN 875 MG PO TABS: 875.0000 mg | ORAL_TABLET | Freq: Two times a day (BID) | ORAL | Status: DC

## 2014-04-20 MED ORDER — PREDNISONE 10 MG PO TABS: ORAL_TABLET | ORAL | Status: DC

## 2014-04-20 NOTE — Progress Notes (Signed)
   Subjective:    Patient ID: Nicholas Wallace, male    DOB: [birth year], 63 y.o.   MRN: [id number]  HPI patient presents with onset last night of significant nasal congestion. This was associated with difficulty breathing through his nose. He has had a fairly significant sore throat with this. He does have a long history of allergies and sinus infections.    Review of Systems     Objective:   Physical Exam patient is alert and cooperative. There is puffiness in the maxillary area bilateral. TMs are clear. The nose has significant congestion. Posterior pharynx is slightly red no exudate. Neck is supple without adenopathy. Chest is clear to both auscultation and percussion  Results for orders placed in visit on 04/20/14  POCT CBC      Result Value Ref Range   WBC 11.3 (*) 4.6 - 10.2 K/uL   Lymph, poc 2.3  0.6 - 3.4   POC LYMPH PERCENT 20.7  10 - 50 %L   MID (cbc) 8.1 (*) 0 - 0.9   POC MID % 7.6  0 - 12 %M   POC Granulocyte 8.1 (*) 2 - 6.9   Granulocyte percent 71.7  37 - 80 %G   RBC 4.96  4.69 - 6.13 M/uL   Hemoglobin 15.6  14.1 - 18.1 g/dL   HCT, POC 46.9  43.5 - 53.7 %   MCV 94.5  80 - 97 fL   MCH, POC 31.5 (*) 27 - 31.2 pg   MCHC 33.4  31.8 - 35.4 g/dL   RDW, POC 13.7     Platelet Count, POC 187  142 - 424 K/uL   MPV 7.2  0 - 99.8 fL  POCT RAPID STREP A (OFFICE)      Result Value Ref Range   Rapid Strep A Screen Negative  Negative   [facility] reading (PRIMARY) by  Dr. Everlene Farrier there is thickening of the maxillary sinus on the right      Assessment & Plan:  Will treat with amoxicillin and prednisone taper.

## 2014-04-20 NOTE — Patient Instructions (Signed)
Sinusitis Sinusitis is redness, soreness, and inflammation of the paranasal sinuses. Paranasal sinuses are air pockets within the bones of your face (beneath the eyes, the middle of the forehead, or above the eyes). In healthy paranasal sinuses, mucus is able to drain out, and air is able to circulate through them by way of your nose. However, when your paranasal sinuses are inflamed, mucus and air can become trapped. This can allow bacteria and other germs to grow and cause infection. Sinusitis can develop quickly and last only a short time (acute) or continue over a long period (chronic). Sinusitis that lasts for more than 12 weeks is considered chronic.  CAUSES  Causes of sinusitis include:  Allergies.  Structural abnormalities, such as displacement of the cartilage that separates your nostrils (deviated septum), which can decrease the air flow through your nose and sinuses and affect sinus drainage.  Functional abnormalities, such as when the small hairs (cilia) that line your sinuses and help remove mucus do not work properly or are not present. SIGNS AND SYMPTOMS  Symptoms of acute and chronic sinusitis are the same. The primary symptoms are pain and pressure around the affected sinuses. Other symptoms include:  Upper toothache.  Earache.  Headache.  Bad breath.  Decreased sense of smell and taste.  A cough, which worsens when you are lying flat.  Fatigue.  Fever.  Thick drainage from your nose, which often is green and may contain pus (purulent).  Swelling and warmth over the affected sinuses. DIAGNOSIS  Your health care provider will perform a physical exam. During the exam, your health care provider may:  Look in your nose for signs of abnormal growths in your nostrils (nasal polyps).  Tap over the affected sinus to check for signs of infection.  View the inside of your sinuses (endoscopy) using an imaging device that has a light attached (endoscope). If your health  care provider suspects that you have chronic sinusitis, one or more of the following tests may be recommended:  Allergy tests.  Nasal culture. A sample of mucus is taken from your nose, sent to a lab, and screened for bacteria.  Nasal cytology. A sample of mucus is taken from your nose and examined by your health care provider to determine if your sinusitis is related to an allergy. TREATMENT  Most cases of acute sinusitis are related to a viral infection and will resolve on their own within 10 days. Sometimes medicines are prescribed to help relieve symptoms (pain medicine, decongestants, nasal steroid sprays, or saline sprays).  However, for sinusitis related to a bacterial infection, your health care provider will prescribe antibiotic medicines. These are medicines that will help kill the bacteria causing the infection.  Rarely, sinusitis is caused by a fungal infection. In theses cases, your health care provider will prescribe antifungal medicine. For some cases of chronic sinusitis, surgery is needed. Generally, these are cases in which sinusitis recurs more than 3 times per year, despite other treatments. HOME CARE INSTRUCTIONS   Drink plenty of water. Water helps thin the mucus so your sinuses can drain more easily.  Use a humidifier.  Inhale steam 3 to 4 times a day (for example, sit in the bathroom with the shower running).  Apply a warm, moist washcloth to your face 3 to 4 times a day, or as directed by your health care provider.  Use saline nasal sprays to help moisten and clean your sinuses.  Take medicines only as directed by your health care provider.    If you were prescribed either an antibiotic or antifungal medicine, finish it all even if you start to feel better. SEEK IMMEDIATE MEDICAL CARE IF:  You have increasing pain or severe headaches.  You have nausea, vomiting, or drowsiness.  You have swelling around your face.  You have vision problems.  You have a stiff  neck.  You have difficulty breathing. MAKE SURE YOU:   Understand these instructions.  Will watch your condition.  Will get help right away if you are not doing well or get worse. Document Released: 07/24/2005 Document Revised: 12/08/2013 Document Reviewed: 08/08/2011 ExitCare Patient Information 2015 [employer]. This information is not intended to replace advice given to you by your health care provider. Make sure you discuss any questions you have with your health care provider. Allergic Rhinitis Allergic rhinitis is when the mucous membranes in the nose respond to allergens. Allergens are particles in the air that cause your body to have an allergic reaction. This causes you to release allergic antibodies. Through a chain of events, these eventually cause you to release histamine into the blood stream. Although meant to protect the body, it is this release of histamine that causes your discomfort, such as frequent sneezing, congestion, and an itchy, runny nose.  CAUSES  Seasonal allergic rhinitis (hay fever) is caused by pollen allergens that may come from grasses, trees, and weeds. Year-round allergic rhinitis (perennial allergic rhinitis) is caused by allergens such as house dust mites, pet dander, and mold spores.  SYMPTOMS   Nasal stuffiness (congestion).  Itchy, runny nose with sneezing and tearing of the eyes. DIAGNOSIS  Your health care provider can help you determine the allergen or allergens that trigger your symptoms. If you and your health care provider are unable to determine the allergen, skin or blood testing may be used. TREATMENT  Allergic rhinitis does not have a cure, but it can be controlled by:  Medicines and allergy shots (immunotherapy).  Avoiding the allergen. Hay fever may often be treated with antihistamines in pill or nasal spray forms. Antihistamines block the effects of histamine. There are over-the-counter medicines that may help with nasal congestion  and swelling around the eyes. Check with your health care provider before taking or giving this medicine.  If avoiding the allergen or the medicine prescribed do not work, there are many new medicines your health care provider can prescribe. Stronger medicine may be used if initial measures are ineffective. Desensitizing injections can be used if medicine and avoidance does not work. Desensitization is when a patient is given ongoing shots until the body becomes less sensitive to the allergen. Make sure you follow up with your health care provider if problems continue. HOME CARE INSTRUCTIONS It is not possible to completely avoid allergens, but you can reduce your symptoms by taking steps to limit your exposure to them. It helps to know exactly what you are allergic to so that you can avoid your specific triggers. SEEK MEDICAL CARE IF:   You have a fever.  You develop a cough that does not stop easily (persistent).  You have shortness of breath.  You start wheezing.  Symptoms interfere with normal daily activities. Document Released: 04/18/2001 Document Revised: 07/29/2013 Document Reviewed: 03/31/2013 ExitCare Patient Information 2015 [employer]. This information is not intended to replace advice given to you by your health care provider. Make sure you discuss any questions you have with your health care provider.  

## 2014-04-28 NOTE — Progress Notes (Signed)
Unable to leave message at this time

## 2014-05-11 NOTE — Progress Notes (Signed)
Patient will have to check his schedule and call back.

## 2014-05-15 ENCOUNTER — Other Ambulatory Visit: Payer: Self-pay | Admitting: Emergency Medicine

## 2014-05-15 NOTE — Progress Notes (Signed)
Scheduled a CPE on 11/24 at 4 pm with Dr. Everlene Farrier.

## 2014-05-15 NOTE — Telephone Encounter (Signed)
Patient requesting a refill on "Cialis"-patient completely out. [facility] [address]. Patients call back number is [phone]

## 2014-06-30 ENCOUNTER — Encounter: Payer: Self-pay | Admitting: Emergency Medicine

## 2014-06-30 ENCOUNTER — Ambulatory Visit (INDEPENDENT_AMBULATORY_CARE_PROVIDER_SITE_OTHER): Payer: BC Managed Care – PPO | Admitting: Emergency Medicine

## 2014-06-30 VITALS — BP 114/76 | HR 64 | Temp 98.0°F | Resp 16 | Ht 70.0 in | Wt 228.8 lb

## 2014-06-30 DIAGNOSIS — I10 Essential (primary) hypertension: Secondary | ICD-10-CM

## 2014-06-30 DIAGNOSIS — Z0000 Encounter for general adult medical examination without abnormal findings: Secondary | ICD-10-CM

## 2014-06-30 DIAGNOSIS — Z23 Encounter for immunization: Secondary | ICD-10-CM

## 2014-06-30 DIAGNOSIS — M25561 Pain in right knee: Secondary | ICD-10-CM

## 2014-06-30 DIAGNOSIS — H6192 Disorder of left external ear, unspecified: Secondary | ICD-10-CM

## 2014-06-30 DIAGNOSIS — C61 Malignant neoplasm of prostate: Secondary | ICD-10-CM

## 2014-06-30 DIAGNOSIS — E785 Hyperlipidemia, unspecified: Secondary | ICD-10-CM

## 2014-06-30 LAB — COMPLETE METABOLIC PANEL WITH GFR
ALT: 22 U/L (ref 0–53)
AST: 25 U/L (ref 0–37)
Albumin: 4.6 g/dL (ref 3.5–5.2)
Alkaline Phosphatase: 52 U/L (ref 39–117)
BUN: 23 mg/dL (ref 6–23)
CO2: 25 meq/L (ref 19–32)
CREATININE: 1.07 mg/dL (ref 0.50–1.35)
Calcium: 9.5 mg/dL (ref 8.4–10.5)
Chloride: 104 mEq/L (ref 96–112)
GFR, EST AFRICAN AMERICAN: 84 mL/min
GFR, Est Non African American: 73 mL/min
GLUCOSE: 75 mg/dL (ref 70–99)
Potassium: 4.5 mEq/L (ref 3.5–5.3)
SODIUM: 138 meq/L (ref 135–145)
TOTAL PROTEIN: 6.9 g/dL (ref 6.0–8.3)
Total Bilirubin: 0.6 mg/dL (ref 0.2–1.2)

## 2014-06-30 LAB — CBC WITH DIFFERENTIAL/PLATELET
Basophils Absolute: 0 10*3/uL (ref 0.0–0.1)
Basophils Relative: 0 % (ref 0–1)
EOS ABS: 0.2 10*3/uL (ref 0.0–0.7)
Eosinophils Relative: 2 % (ref 0–5)
HEMATOCRIT: 48.2 % (ref 39.0–52.0)
HEMOGLOBIN: 16.5 g/dL (ref 13.0–17.0)
Lymphocytes Relative: 38 % (ref 12–46)
Lymphs Abs: 3.4 10*3/uL (ref 0.7–4.0)
MCH: 31.5 pg (ref 26.0–34.0)
MCHC: 34.2 g/dL (ref 30.0–36.0)
MCV: 92 fL (ref 78.0–100.0)
MONO ABS: 0.9 10*3/uL (ref 0.1–1.0)
MONOS PCT: 10 % (ref 3–12)
MPV: 10.4 fL (ref 9.4–12.4)
Neutro Abs: 4.5 10*3/uL (ref 1.7–7.7)
Neutrophils Relative %: 50 % (ref 43–77)
Platelets: 215 10*3/uL (ref 150–400)
RBC: 5.24 MIL/uL (ref 4.22–5.81)
RDW: 13.6 % (ref 11.5–15.5)
WBC: 8.9 10*3/uL (ref 4.0–10.5)

## 2014-06-30 LAB — POCT URINALYSIS DIPSTICK
BILIRUBIN UA: NEGATIVE
Glucose, UA: NEGATIVE
KETONES UA: NEGATIVE
Leukocytes, UA: NEGATIVE
Nitrite, UA: NEGATIVE
PH UA: 5
Protein, UA: NEGATIVE
RBC UA: NEGATIVE
Spec Grav, UA: 1.02
Urobilinogen, UA: 0.2

## 2014-06-30 LAB — LIPID PANEL
Cholesterol: 179 mg/dL (ref 0–200)
HDL: 38 mg/dL — ABNORMAL LOW (ref >39)
LDL Cholesterol: 83 mg/dL (ref 0–99)
Total CHOL/HDL Ratio: 4.7 Ratio
Triglycerides: 289 mg/dL — ABNORMAL HIGH (ref <150)
VLDL: 58 mg/dL — ABNORMAL HIGH (ref 0–40)

## 2014-06-30 LAB — IFOBT (OCCULT BLOOD): IMMUNOLOGICAL FECAL OCCULT BLOOD TEST: NEGATIVE

## 2014-06-30 MED ORDER — ATORVASTATIN CALCIUM 40 MG PO TABS: 40.0000 mg | ORAL_TABLET | Freq: Every day | ORAL | Status: DC

## 2014-06-30 MED ORDER — LISINOPRIL 40 MG PO TABS: 40.0000 mg | ORAL_TABLET | Freq: Every day | ORAL | Status: DC

## 2014-06-30 MED ORDER — FLUTICASONE PROPIONATE 50 MCG/ACT NA SUSP
2.0000 | Freq: Every day | NASAL | Status: AC
Start: 2014-06-30 — End: ?

## 2014-06-30 MED ORDER — ZOSTER VACCINE LIVE 19400 UNT/0.65ML ~~LOC~~ SOLR: 0.6500 mL | Freq: Once | SUBCUTANEOUS | Status: DC

## 2014-06-30 NOTE — Progress Notes (Signed)
Subjective:  This chart was scribed for Arlyss Queen, MD by Erling Conte, Medical Scribe. This patient was seen in Room 22 and the patient's care was started at 4:20 PM.   Patient ID: Nicholas Wallace, male    DOB: 06/08/1951, 63 y.o.   MRN: [id number]  Chief Complaint  Patient presents with  . Annual Exam    with MEDICATION REFILL-LIPITOR,LISINOPRIL and FLONASE    HPI HPI Comments: Nicholas Wallace is a 63 y.o. male who presents to the [facility] for his annual CPE. He has gotten his annual flu shot. Pt is a non smoker. Pt used to regularly see a urologist but has now been released. His last t-dap was September 2014. He had back surgery in 1985 and neck surgery in 1995. He has no complaints at this time. Pt needs a medication refill for his Lipitor, Lisinopril and Flonase  Patient Active Problem List   Diagnosis Date Noted  . Abnormal EKG   . MALIGNANT NEOPLASM OF PROSTATE 06/04/2008  . DYSLIPIDEMIA 06/04/2008  . ESSENTIAL HYPERTENSION, BENIGN 06/04/2008   Past Medical History  Diagnosis Date  . Abnormal EKG 02/19/2012  . Allergy   . Cancer   . Hypertension   . Blood transfusion without reported diagnosis    Past Surgical History  Procedure Laterality Date  . Back surgery  1985    [facility]  . Prostatectomy    . Lymphadenectomy  2009    pelvic lymphadenectomy by Dr. Irine Seal  . Lumbar laminectomy    . Cervical kidney stones     No Known Allergies Prior to Admission medications   Medication Sig Start Date End Date Taking? Authorizing Provider  aspirin 81 MG tablet Take 81 mg by mouth daily.   Yes Historical Provider, MD  atorvastatin (LIPITOR) 40 MG tablet Take 1 tablet (40 mg total) by mouth at bedtime. 04/28/13  Yes Darlyne Russian, MD  CIALIS 20 MG tablet TAKE 1 TABLET BY MOUTH AS NEEDED AS DIRECTED 05/16/14  Yes Darlyne Russian, MD  fluticasone (FLONASE) 50 MCG/ACT nasal spray Place 2 sprays into the nose daily. 04/28/13  Yes Darlyne Russian, MD    lisinopril (PRINIVIL,ZESTRIL) 40 MG tablet Take 1 tablet (40 mg total) by mouth daily. 04/15/14  Yes Chelle S Jeffery, PA-C  Multiple Vitamin (MULTIVITAMIN WITH MINERALS) TABS Take 1 tablet by mouth daily.   Yes Historical Provider, MD  predniSONE (DELTASONE) 10 MG tablet Take 4 a day for 3 days 3 a day for 3 days 2 a day for 3 days one a day for 3 days Patient not taking: Reported on 06/30/2014 04/20/14   Darlyne Russian, MD   History   Social History  . Marital Status: Married    Spouse Name: [name]    Number of Children: 6  . 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 Smoking status: Never Smoker   . Smokeless tobacco: Never Used  . Alcohol Use: 0.0 - 0.5 oz/week    0-1 drink(s) per week  . Drug Use: No  . Sexual Activity: Yes    Birth Control/ Protection: None   Other Topics Concern  . Not on file   Social History Narrative   Lives with his wife and their daughter.  They each have children from previous relationships.     Review of Systems  Constitutional:  Negative for fatigue and unexpected weight change.  Eyes: Negative for visual disturbance.  Respiratory: Negative for cough, chest tightness and shortness of breath.   Cardiovascular: Negative for chest pain, palpitations and leg swelling.  Gastrointestinal: Negative for abdominal pain and blood in stool.  Neurological: Negative for dizziness, light-headedness and headaches.       Objective:   Physical Exam CONSTITUTIONAL: Well developed/well nourished HEAD: Normocephalic/atraumatic EYES: EOMI/PERRL ENMT: Mucous membranes moist. NECK: supple no meningeal signs SPINE/BACK:entire spine nontender CV: S1/S2 noted, no murmurs/rubs/gallops noted LUNGS: Lungs are clear to auscultation bilaterally, no apparent distress ABDOMEN: soft, nontender, no rebound or guarding, bowel sounds noted throughout abdomen GU:no  cva tenderness. Surgically absent prostate gland.  NEURO: Pt is awake/alert/appropriate, moves all extremitiesx4.  No facial droop.   EXTREMITIES: pulses normal/equal, full ROM. Significant degenerative changes of both knees.  SKIN: warm, color normal. 29mm flat pigmented area at entrance to left ear just superior to tragus PSYCH: no abnormalities of mood noted, alert and oriented to situation   Assessment & Plan:  Referred to Dr. Tonita Cong for right knee pain. Referral made to dermatology for pigmented lesion of left ear. Routine labs were done and meds were refilled.   I personally performed the services described in this documentation, which was scribed in my presence. The recorded information has been reviewed and is accurate.

## 2014-07-01 LAB — PSA

## 2014-08-04 ENCOUNTER — Ambulatory Visit (INDEPENDENT_AMBULATORY_CARE_PROVIDER_SITE_OTHER): Payer: BC Managed Care – PPO | Admitting: Family Medicine

## 2014-08-04 VITALS — BP 120/70 | HR 66 | Temp 98.0°F | Resp 18 | Ht 71.0 in | Wt 235.2 lb

## 2014-08-04 DIAGNOSIS — R0981 Nasal congestion: Secondary | ICD-10-CM

## 2014-08-04 DIAGNOSIS — IMO0001 Reserved for inherently not codable concepts without codable children: Secondary | ICD-10-CM

## 2014-08-04 DIAGNOSIS — R030 Elevated blood-pressure reading, without diagnosis of hypertension: Secondary | ICD-10-CM

## 2014-08-04 DIAGNOSIS — R519 Headache, unspecified: Secondary | ICD-10-CM

## 2014-08-04 DIAGNOSIS — R51 Headache: Secondary | ICD-10-CM

## 2014-08-04 MED ORDER — AZELASTINE HCL 0.15 % NA SOLN: 2.0000 | Freq: Two times a day (BID) | NASAL | Status: AC

## 2014-08-04 MED ORDER — CETIRIZINE HCL 10 MG PO TABS: 10.0000 mg | ORAL_TABLET | Freq: Every day | ORAL | Status: DC

## 2014-08-04 NOTE — Progress Notes (Signed)
Subjective:    Patient ID: Nicholas Wallace, male    DOB: 09/19/1950, 63 y.o.   MRN: [id number]  This chart was scribed for Shawnee Knapp, MD, by Stephania Fragmin, ED Scribe. This patient was seen in room 8 and the patient's care was started at 4:17 PM.   Chief Complaint  Patient presents with  . Hypertension    Pt. has been monitoring BP at home but has been getting high readings.      HPI  HPI Comments: Nicholas Wallace is a 63 y.o. male with self-reported well-controlled chronic hypertension, who presents to the [facility] complaining of hypertension, per home BP monitor readings. Patient takes home measurements after taking Lisinopril, and has in the past couple of weeks gotten 150-160/100 readings. Patient's last home BP reading was 139/95, although he sometimes gets normal readings of around 125/70. He once took 2 Lisinopril tabs, and it was still high. Although he suspected a defective home monitor, his wife reported a reading of 112/70.  He complains of associated periorbital headaches and sinus pain that have become more frequent, in the last 2 weeks, with no known triggers. He also complains of associated tiredness after coming home from work, though he feels better after sleeping 2-3 hours. Patient states he feels draggy after waking up in the morning, and states he gets up to void once a night. He has tried OTC medication with no relief. He started to use Flonase nasal spray recently. Patient last had an eye exam a few months prior, which returned normal. He confirms that he has increased caffeine consumption slightly for work, and has drank 5-hour Energy shots in the past few weeks. He drinks water, grapefruit juice, and Lipton diet green tea. He has a past history of kidney stones, with the first requiring surgical removal. Patient also has a history of prostatectomy in 2009 at [facility] for prostate cancer. A physical a few months prior revealed complete remission. He  denies nasal congestion and cough. He denies use of other seasonal allergy medication.   Past Medical History  Diagnosis Date  . Abnormal EKG 02/19/2012  . Allergy   . Cancer   . Hypertension   . Blood transfusion without reported diagnosis     Current Outpatient Prescriptions on File Prior to Visit  Medication Sig Dispense Refill  . aspirin 81 MG tablet Take 81 mg by mouth daily.    Marland Kitchen atorvastatin (LIPITOR) 40 MG tablet Take 1 tablet (40 mg total) by mouth at bedtime. 90 tablet 3  . CIALIS 20 MG tablet TAKE 1 TABLET BY MOUTH AS NEEDED AS DIRECTED 106 tablet 0  . fluticasone (FLONASE) 50 MCG/ACT nasal spray Place 2 sprays into both nostrils daily. 16 g 11  . lisinopril (PRINIVIL,ZESTRIL) 40 MG tablet Take 1 tablet (40 mg total) by mouth daily. 90 tablet 3  . Multiple Vitamin (MULTIVITAMIN WITH MINERALS) TABS Take 1 tablet by mouth daily.    Marland Kitchen zoster vaccine live, PF, (ZOSTAVAX) 15176 UNT/0.65ML injection Inject 19,400 Units into the skin once. 1 each 0   No current facility-administered medications on file prior to visit.      Review of Systems  Constitutional: Positive for activity change (more sleep) and fatigue. Negative for fever and appetite change.  HENT: Positive for sinus pressure. Negative for congestion.   Eyes: Negative for photophobia, pain, discharge, redness, itching and visual disturbance.  Respiratory: Negative for cough.   Neurological: Positive for headaches.  Psychiatric/Behavioral:  Negative for sleep disturbance.       Objective:  BP 120/70 mmHg  Pulse 66  Temp(Src) 98 F (36.7 C) (Oral)  Resp 18  Ht 5\' 11"  (1.803 m)  Wt 235 lb 3.2 oz (106.686 kg)  BMI 32.82 kg/m2  SpO2 98%   Physical Exam  Constitutional: He is oriented to person, place, and time. He appears well-developed and well-nourished. No distress.  HENT:  Head: Normocephalic and atraumatic.  Mouth/Throat: Posterior oropharyngeal erythema (mild) present. No oropharyngeal exudate or  posterior oropharyngeal edema.  Eyes: Conjunctivae and EOM are normal.  Neck: Neck supple. No tracheal deviation present. No thyromegaly present.  Cardiovascular: Normal rate.   Pulmonary/Chest: Effort normal. No respiratory distress.  Musculoskeletal: Normal range of motion.  Lymphadenopathy:    He has no cervical adenopathy.  Neurological: He is alert and oriented to person, place, and time.  Skin: Skin is warm and dry.  Psychiatric: He has a normal mood and affect. His behavior is normal.  Nursing note and vitals reviewed.         Assessment & Plan:   4:27 PM -Discussed treatment plan which includes . Elevated blood pressure- Pt instructed to call the company that sells his home blood pressure monitor so it can be repaired or replaced.  Instruct patient to if symptoms persist, including hypertension with headache, after 2 weeks; will start him on Amlodipine 2.5 mg to try to treat.   Sinus congestion - restart allergy medication  Sinus headache  Meds ordered this encounter  Medications  . cetirizine (ZYRTEC) 10 MG tablet    Sig: Take 1 tablet (10 mg total) by mouth daily.    Dispense:  30 tablet    Refill:  11  . Azelastine HCl 0.15 % SOLN    Sig: Place 2 sprays into the nose 2 (two) times daily.    Dispense:  30 mL    Refill:  0    I personally performed the services described in this documentation, which was scribed in my presence. The recorded information has been reviewed and considered, and addended by me as needed.  Delman Cheadle, MD MPH

## 2014-08-04 NOTE — Patient Instructions (Signed)
Sinus Headache °A sinus headache is when your sinuses become clogged or swollen. Sinus headaches can range from mild to severe.  °CAUSES °A sinus headache can have different causes, such as: °· Colds. °· Sinus infections. °· Allergies. °SYMPTOMS  °Symptoms of a sinus headache may vary and can include: °· Headache. °· Pain or pressure in the face. °· Congested or runny nose. °· Fever. °· Inability to smell. °· Pain in upper teeth. °Weather changes can make symptoms worse. °TREATMENT  °The treatment of a sinus headache depends on the cause. °· Sinus pain caused by a sinus infection may be treated with antibiotic medicine. °· Sinus pain caused by allergies may be helped by allergy medicines (antihistamines) and medicated nasal sprays. °· Sinus pain caused by congestion may be helped by flushing the nose and sinuses with saline solution. °HOME CARE INSTRUCTIONS  °· If antibiotics are prescribed, take them as directed. Finish them even if you start to feel better. °· Only take over-the-counter or prescription medicines for pain, discomfort, or fever as directed by your caregiver. °· If you have congestion, use a nasal spray to help reduce pressure. °SEEK IMMEDIATE MEDICAL CARE IF: °· You have a fever. °· You have headaches more than once a week. °· You have sensitivity to light or sound. °· You have repeated nausea and vomiting. °· You have vision problems. °· You have sudden, severe pain in your face or head. °· You have a seizure. °· You are confused. °· Your sinus headaches do not get better after treatment. Many people think they have a sinus headache when they actually have migraines or tension headaches. °MAKE SURE YOU:  °· Understand these instructions. °· Will watch your condition. °· Will get help right away if you are not doing well or get worse. °Document Released: 08/31/2004 Document Revised: 10/16/2011 Document Reviewed: 10/22/2010 °ExitCare® Patient Information ©2015 [employer]. This information is not  intended to replace advice given to you by your health care provider. Make sure you discuss any questions you have with your health care provider. ° °

## 2014-09-06 ENCOUNTER — Ambulatory Visit (INDEPENDENT_AMBULATORY_CARE_PROVIDER_SITE_OTHER): Payer: BLUE CROSS/BLUE SHIELD | Admitting: Internal Medicine

## 2014-09-06 VITALS — BP 136/77 | HR 59 | Temp 98.9°F | Resp 20 | Ht 69.5 in | Wt 233.0 lb

## 2014-09-06 DIAGNOSIS — L089 Local infection of the skin and subcutaneous tissue, unspecified: Secondary | ICD-10-CM

## 2014-09-06 DIAGNOSIS — L723 Sebaceous cyst: Secondary | ICD-10-CM

## 2014-09-06 MED ORDER — CEPHALEXIN 500 MG PO CAPS: 500.0000 mg | ORAL_CAPSULE | Freq: Three times a day (TID) | ORAL | Status: DC

## 2014-09-06 NOTE — Progress Notes (Signed)
Procedure: Verbal consent obtained.  Patient anesthetized with 2% lidocaine with epinephrine.  A three mm

## 2014-09-06 NOTE — Progress Notes (Addendum)
Subjective:  This chart was scribed for Tami Lin, MD by Dellis Filbert, ED Scribe at [facility].The patient was seen in exam room 14 and the patient's care was started at 4:13 PM.   Patient ID: Nicholas Wallace, male    DOB: 1951-05-16, 64 y.o.   MRN: [id number] Chief Complaint  Patient presents with  . Mass    back   HPI  HPI Comments: Nicholas Wallace is a 64 y.o. male who presents to [facility] complaining of a cyst on the middle of his back. Pt was seen here for a similar complaint in May 2015 but the cyst was not drained. Pt reports the area is tender to the touch.  Patient Active Problem List   Diagnosis Date Noted  . Abnormal EKG   . MALIGNANT NEOPLASM OF PROSTATE 06/04/2008  . DYSLIPIDEMIA 06/04/2008  . ESSENTIAL HYPERTENSION, BENIGN 06/04/2008   Past Medical History  Diagnosis Date  . Abnormal EKG 02/19/2012  . Allergy   . Cancer   . Hypertension   . Blood transfusion without reported diagnosis    Past Surgical History  Procedure Laterality Date  . Back surgery  1985    [facility]  . Prostatectomy    . Lymphadenectomy  2009    pelvic lymphadenectomy by Dr. Irine Seal  . Lumbar laminectomy    . Cervical kidney stones     No Known Allergies Prior to Admission medications   Medication Sig Start Date End Date Taking? Authorizing Provider  aspirin 81 MG tablet Take 81 mg by mouth daily.   Yes Historical Provider, MD  atorvastatin (LIPITOR) 40 MG tablet Take 1 tablet (40 mg total) by mouth at bedtime. 06/30/14  Yes Darlyne Russian, MD  Azelastine HCl 0.15 % SOLN Place 2 sprays into the nose 2 (two) times daily. 08/04/14  Yes Shawnee Knapp, MD  cetirizine (ZYRTEC) 10 MG tablet Take 1 tablet (10 mg total) by mouth daily. 08/04/14  Yes Shawnee Knapp, MD  CIALIS 20 MG tablet TAKE 1 TABLET BY MOUTH AS NEEDED AS DIRECTED 05/16/14  Yes Darlyne Russian, MD  fluticasone (FLONASE) 50 MCG/ACT nasal spray Place 2 sprays into both nostrils daily. 06/30/14  Yes Darlyne Russian, MD  lisinopril (PRINIVIL,ZESTRIL) 40 MG tablet Take 1 tablet (40 mg total) by mouth daily. 06/30/14  Yes Darlyne Russian, MD  Multiple Vitamin (MULTIVITAMIN WITH MINERALS) TABS Take 1 tablet by mouth daily.   Yes Historical Provider, MD  zoster vaccine live, PF, (ZOSTAVAX) 24235 UNT/0.65ML injection Inject 19,400 Units into the skin once. Patient not taking: Reported on 09/06/2014 06/30/14   Darlyne Russian, MD   History   Social History  . Marital Status: Married    Spouse Name: [name]    Number of Children: 6  . 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 Smoking status: Never Smoker   . Smokeless tobacco: Never Used  . Alcohol Use: 0.0 - 0.5 oz/week    0-1 drink(s) per week  . Drug Use: No  . Sexual Activity: Yes    Birth Control/ Protection: None   Other Topics Concern  . Not on file   Social History Narrative   Lives with his wife and their daughter.  They each have children from previous relationships.    Review of Systems Noncontributory    Objective:  BP 136/77 mmHg  Pulse 59  Temp(Src) 98.9 F (37.2 C) (Oral)  Resp 20  Ht 5' 9.5" (1.765 m)  Wt 233 lb (105.688 kg)  BMI 33.93 kg/m2  SpO2 96%  Physical Exam  Constitutional: He is oriented to person, place, and time. He appears well-developed and well-nourished. No distress.  HENT:  Head: Normocephalic and atraumatic.  Eyes: Pupils are equal, round, and reactive to light.  Neck: Normal range of motion.  Cardiovascular: Normal rate and regular rhythm.   Pulmonary/Chest: Effort normal. No respiratory distress.  Musculoskeletal: Normal range of motion.  Neurological: He is alert and oriented to person, place, and time.  Skin: Skin is warm and dry.  Mid back a 2 cm by 2 cm swollen red area with sebaceous material extruding.   Psychiatric: He has a normal mood and affect. His behavior is normal.  Nursing  note and vitals reviewed.      Assessment & Plan:  Infected sebaceous cyst  Wound care with follow-up Keflex 500 3 times a day for 5 days Culture pending  I have completed the patient encounter in its entirety as documented by the scribe, with editing by me where necessary. Theressa Piedra P. Laney Pastor, M.D.

## 2014-09-08 ENCOUNTER — Ambulatory Visit (INDEPENDENT_AMBULATORY_CARE_PROVIDER_SITE_OTHER): Payer: BLUE CROSS/BLUE SHIELD | Admitting: Physician Assistant

## 2014-09-08 VITALS — BP 128/88 | HR 68 | Temp 98.4°F | Resp 18 | Ht 69.5 in | Wt 232.4 lb

## 2014-09-08 DIAGNOSIS — L723 Sebaceous cyst: Secondary | ICD-10-CM

## 2014-09-08 DIAGNOSIS — L089 Local infection of the skin and subcutaneous tissue, unspecified: Secondary | ICD-10-CM

## 2014-09-08 NOTE — Progress Notes (Signed)
   09/08/2014 at 12:17 PM  Raelyn Ensign / DOB: 06/02/1951 / MRN: [id number]  The patient has MALIGNANT NEOPLASM OF PROSTATE; DYSLIPIDEMIA; ESSENTIAL HYPERTENSION, BENIGN; and Abnormal EKG on his problem list.  SUBJECTIVE  Chief compalaint: Wound Check   History of present illness: Mr. Tout is 64 y.o. well appearing male presenting for wound care of a s/p sebaceous cyst I&D. He was prescribed Keflex and has been compliant with therapy. He has no complaints today, and denies nausea, presyncope and fever.   He  has a past medical history of Abnormal EKG (02/19/2012); Allergy; Cancer; Hypertension; and Blood transfusion without reported diagnosis.    He has a current medication list which includes the following prescription(s): aspirin, atorvastatin, azelastine hcl, cephalexin, cetirizine, cialis, fluticasone, lisinopril, multivitamin with minerals, and zoster vaccine live (pf).  Mr. Dieppa has No Known Allergies. He  reports that he has never smoked. He has never used smokeless tobacco. He reports that he drinks alcohol. He reports that he does not use illicit drugs. He  reports that he currently engages in sexual activity. He reports using the following method of birth control/protection: None.  The patient  has past surgical history that includes Back surgery (1985); Prostatectomy; Lymphadenectomy (2009); Lumbar laminectomy; and cervical kidney stones.  His family history includes Alzheimer's disease in his mother; Cancer in his paternal grandmother; Heart disease in his maternal grandmother.  Review of Systems  Constitutional: Negative.   Skin: Positive for rash. Negative for itching.    OBJECTIVE  His  height is 5' 9.5" (1.765 m) and weight is 232 lb 6.4 oz (105.416 kg). His oral temperature is 98.4 F (36.9 C). His blood pressure is 128/88 and his pulse is 68. His respiration is 18 and oxygen saturation is 95%.  The patient's body mass index is 33.84 kg/(m^2).  Physical Exam    Constitutional: He appears well-developed and well-nourished. No distress.  Skin: Skin is warm and dry. He is not diaphoretic.       No results found for this or any previous visit (from the past 24 hour(s)).  [place] was seen today for wound check.  Diagnoses and associated orders for this visit:  Infected sebaceous cyst of skin: F/U in 48 hours for recheck. Will likely require require further packing.  Patient advised to continue ABX.      The patient was instructed to to call or comeback to clinic as needed, or should symptoms warrant.  Philis Fendt, MHS, PA-C [facility] 09/08/2014 12:17 PM

## 2014-09-09 LAB — WOUND CULTURE: Gram Stain: NONE SEEN

## 2014-09-10 ENCOUNTER — Ambulatory Visit (INDEPENDENT_AMBULATORY_CARE_PROVIDER_SITE_OTHER): Payer: BLUE CROSS/BLUE SHIELD | Admitting: Physician Assistant

## 2014-09-10 ENCOUNTER — Encounter: Payer: Self-pay | Admitting: Physician Assistant

## 2014-09-10 VITALS — BP 124/82 | HR 62 | Temp 98.5°F | Resp 16

## 2014-09-10 DIAGNOSIS — L089 Local infection of the skin and subcutaneous tissue, unspecified: Secondary | ICD-10-CM

## 2014-09-10 DIAGNOSIS — L723 Sebaceous cyst: Secondary | ICD-10-CM

## 2014-09-10 NOTE — Progress Notes (Signed)
   09/10/2014 at 12:42 PM  Nicholas Wallace / DOB: 04/09/1951 / MRN: [id number]  The patient has MALIGNANT NEOPLASM OF PROSTATE; DYSLIPIDEMIA; ESSENTIAL HYPERTENSION, BENIGN; and Abnormal EKG on his problem list.  SUBJECTIVE  Chief compalaint: Wound Check   History of present illness: Nicholas Wallace is 64 y.o. well appearing male presenting for wound care of a s/p sebaceous cyst I&D ~6 days previous. Nicholas Wallace was prescribed Keflex and has been compliant with therapy. Nicholas Wallace complains of some mild fatigue today, but denies nausea, presyncope and fever. Nicholas Wallace feels the wound is getting better.    Nicholas Wallace  has a past medical history of Abnormal EKG (02/19/2012); Allergy; Cancer; Hypertension; and Blood transfusion without reported diagnosis.    Nicholas Wallace has a current medication list which includes the following prescription(s): aspirin, atorvastatin, azelastine hcl, cephalexin, cetirizine, cialis, fluticasone, lisinopril, multivitamin with minerals, and zoster vaccine live (pf).  Nicholas Wallace has No Known Allergies. Nicholas Wallace  reports that Nicholas Wallace has never smoked. Nicholas Wallace has never used smokeless tobacco. Nicholas Wallace reports that Nicholas Wallace drinks alcohol. Nicholas Wallace reports that Nicholas Wallace does not use illicit drugs. Nicholas Wallace  reports that Nicholas Wallace currently engages in sexual activity. Nicholas Wallace reports using the following method of birth control/protection: None.  The patient  has past surgical history that includes Back surgery (1985); Prostatectomy; Lymphadenectomy (2009); Lumbar laminectomy; and cervical kidney stones.  His family history includes Alzheimer's disease in his mother; Cancer in his paternal grandmother; Heart disease in his maternal grandmother.  Review of Systems  Constitutional: Negative for fever and chills.  Skin: Positive for rash.  Neurological: Negative for dizziness.    OBJECTIVE  His  oral temperature is 98.5 F (36.9 C). His blood pressure is 124/82 and his pulse is 62. His respiration is 16 and oxygen saturation is 98%.  The patient's body mass index is unknown  because there is no weight on file.  Physical Exam  Constitutional: Nicholas Wallace is oriented to person, place, and time. Nicholas Wallace appears well-developed and well-nourished. No distress.  Neurological: Nicholas Wallace is alert and oriented to person, place, and time.  Skin: Skin is warm and dry. Nicholas Wallace is not diaphoretic.     Psychiatric: Nicholas Wallace has a normal mood and affect.    No results found for this or any previous visit (from the past 24 hour(s)).  [place] was seen today for wound check.  Diagnoses and associated orders for this visit:  Infected sebaceous cyst of skin: F/U in 48 hours for recheck. Will likely require require further packing.  Patient advised to continue ABX.      The patient was instructed to to call or comeback to clinic as needed, or should symptoms warrant.  Philis Fendt, MHS, PA-C [facility] 09/10/2014 12:42 PM

## 2014-09-12 ENCOUNTER — Ambulatory Visit (INDEPENDENT_AMBULATORY_CARE_PROVIDER_SITE_OTHER): Payer: BLUE CROSS/BLUE SHIELD | Admitting: Physician Assistant

## 2014-09-12 VITALS — BP 100/60 | HR 60 | Temp 98.7°F | Resp 16

## 2014-09-12 DIAGNOSIS — L089 Local infection of the skin and subcutaneous tissue, unspecified: Secondary | ICD-10-CM

## 2014-09-12 DIAGNOSIS — L723 Sebaceous cyst: Secondary | ICD-10-CM

## 2014-09-12 NOTE — Progress Notes (Signed)
    MRN: [id number] DOB: [birth year]  Subjective:   Nicholas Wallace is a 64 y.o. male presenting for chief complaint of Wound Check Patient is here for Day 6 of sebaceous cyst s/p incision and drainage.  Patient reports that the wound is doing well.  He reports no pain or swelling.  He has not changed the bandage sense the last visit 2 days prior, but has no fever, abnormal skin changes, or heavy drainage.    Nicholas Wallace has a current medication list which includes the following prescription(s): aspirin, atorvastatin, azelastine hcl, cephalexin, cetirizine, cialis, lisinopril, multivitamin with minerals, fluticasone, and zoster vaccine live (pf).  He has No Known Allergies.  Nicholas Wallace  has a past medical history of Abnormal EKG (02/19/2012); Allergy; Cancer; Hypertension; and Blood transfusion without reported diagnosis. Also  has past surgical history that includes Back surgery (1985); Prostatectomy; Lymphadenectomy (2009); Lumbar laminectomy; and cervical kidney stones.  ROS As in subjective.  Objective:   Vitals: BP 100/60 mmHg  Pulse 60  Temp(Src) 98.7 F (37.1 C) (Oral)  Resp 16  SpO2 97%  Physical Exam  Constitutional: He is oriented to person, place, and time and well-developed, well-nourished, and in no distress. No distress.  HENT:  Head: Normocephalic.  Cardiovascular: Normal rate.   Pulmonary/Chest: Effort normal and breath sounds normal. No respiratory distress.  Neurological: He is alert and oriented to person, place, and time.  Skin: Skin is warm and dry.  Psychiatric: Mood and affect normal.    Procedure: Verbal consent obtained. 1% Lidocaine and normal saline applied to irrigate the wound.  No sebaceous material gathered with irrigation.  Very minimal sebaceous fluid extracted.  Wound explored.  Sebaceous material consistent with sac retrieved from site.  With more exploring the depth of wound is spanning 1.5cm medially. More search for sac material without appreciating.   Final irrigation with normal saline, then 1/4 strip gently packed.  Dressings applied.     Assessment and Plan :  64 year old male is here today for follow up of day 6 of incision and drainage of a sebaceous cyst.   This appears to be improving well.  Hopeful that the depth of wound will reduce, and next visit will be last need for packing.  Changing bandages q 24 hrs advised.  Patient advised to return in 3 days for wound recheck.    Infected sebaceous cyst of skin  Ivar Drape, PA-C [facility] 2/7/201610:32 PM

## 2014-09-13 ENCOUNTER — Encounter: Payer: Self-pay | Admitting: Physician Assistant

## 2014-09-14 ENCOUNTER — Ambulatory Visit (INDEPENDENT_AMBULATORY_CARE_PROVIDER_SITE_OTHER): Payer: BLUE CROSS/BLUE SHIELD | Admitting: Physician Assistant

## 2014-09-14 VITALS — BP 110/64 | HR 66 | Temp 97.8°F | Resp 18 | Ht 69.5 in | Wt 232.0 lb

## 2014-09-14 DIAGNOSIS — L089 Local infection of the skin and subcutaneous tissue, unspecified: Secondary | ICD-10-CM

## 2014-09-14 DIAGNOSIS — L723 Sebaceous cyst: Secondary | ICD-10-CM

## 2014-09-14 NOTE — Progress Notes (Signed)
   09/14/2014 at 5:57 PM  Purvis Kilts / DOB: [birth year] / MRN: [id number]  The patient has MALIGNANT NEOPLASM OF PROSTATE; DYSLIPIDEMIA; ESSENTIAL HYPERTENSION, BENIGN; and Abnormal EKG on his problem list.  SUBJECTIVE  Chief compalaint: Wound Check   History of present illness: Mr. Tuller is 64 y.o. well appearing male presenting for wound care of a s/p sebaceous cyst I&D ~9 days previous. He was prescribed Keflex and has been compliant with therapy. He complains of some mild fatigue today, but denies nausea, presyncope and fever. He feels the wound is getting better.    He  has a past medical history of Abnormal EKG (02/19/2012); Allergy; Cancer; Hypertension; and Blood transfusion without reported diagnosis.    He has a current medication list which includes the following prescription(s): aspirin, atorvastatin, azelastine hcl, cephalexin, cetirizine, cialis, fluticasone, lisinopril, multivitamin with minerals, and zoster vaccine live (pf).  Mr. Biehn has No Known Allergies. He  reports that he has never smoked. He has never used smokeless tobacco. He reports that he drinks alcohol. He reports that he does not use illicit drugs. He  reports that he currently engages in sexual activity. He reports using the following method of birth control/protection: None.  The patient  has past surgical history that includes Back surgery (1985); Prostatectomy; Lymphadenectomy (2009); Lumbar laminectomy; and cervical kidney stones.  His family history includes Alzheimer's disease in his mother; Cancer in his paternal grandmother; Heart disease in his maternal grandmother.  ROS  OBJECTIVE  His  height is 5' 9.5" (1.765 m) and weight is 232 lb (105.235 kg). His oral temperature is 97.8 F (36.6 C). His blood pressure is 110/64 and his pulse is 66. His respiration is 18 and oxygen saturation is 97%.  The patient's body mass index is 33.78 kg/(m^2).  Physical Exam  Constitutional: He is oriented to  person, place, and time. He appears well-developed and well-nourished. No distress.  Neurological: He is alert and oriented to person, place, and time.  Skin: Skin is warm and dry. He is not diaphoretic.     Psychiatric: He has a normal mood and affect.    No results found for this or any previous visit (from the past 24 hour(s)).  [place] was seen today for wound check.  Diagnoses and associated orders for this visit:  Infected sebaceous cyst of skin: No further follow up for this problem required. Patient may present in 4-6 weeks for cyst sac excision.        The patient was instructed to to call or comeback to clinic as needed, or should symptoms warrant.  Philis Fendt, MHS, PA-C [facility] 09/14/2014 5:57 PM

## 2014-12-29 ENCOUNTER — Ambulatory Visit (INDEPENDENT_AMBULATORY_CARE_PROVIDER_SITE_OTHER): Payer: BLUE CROSS/BLUE SHIELD | Admitting: Emergency Medicine

## 2014-12-29 VITALS — BP 110/66 | HR 73 | Temp 98.4°F | Resp 16 | Ht 70.5 in | Wt 237.4 lb

## 2014-12-29 DIAGNOSIS — J0140 Acute pansinusitis, unspecified: Secondary | ICD-10-CM

## 2014-12-29 DIAGNOSIS — H60391 Other infective otitis externa, right ear: Secondary | ICD-10-CM

## 2014-12-29 MED ORDER — NEOMYCIN-POLYMYXIN-HC 3.5-10000-1 OT SUSP
4.0000 [drp] | Freq: Four times a day (QID) | OTIC | Status: DC
Start: 2014-12-29 — End: 2015-01-06

## 2014-12-29 MED ORDER — AMOXICILLIN-POT CLAVULANATE 875-125 MG PO TABS: 1.0000 | ORAL_TABLET | Freq: Two times a day (BID) | ORAL | Status: DC

## 2014-12-29 MED ORDER — PSEUDOEPHEDRINE-GUAIFENESIN ER 60-600 MG PO TB12: 1.0000 | ORAL_TABLET | Freq: Two times a day (BID) | ORAL | Status: DC

## 2014-12-29 NOTE — Progress Notes (Signed)
Subjective:  Patient ID: Nicholas Wallace, male    DOB: 09/03/1950  Age: 64 y.o. MRN: [id number]  CC: Neck Pain and Ear Pain   HPI Waris Rodger presents for evaluation of right ear pain. He says that he has nasal congestion and postnasal drainage as well as a purulent nasal discharge. He has some minimal scratchy throat. He has no fever chills. No cough, wheezing or shortness of breath. No nausea or vomiting. He is noted that the pain in his right ear is worse when he touches his external ear or move his jaw. He has not been swimming. He denies any improvement with over-the-counter medication.  Outpatient Prescriptions Prior to Visit  Medication Sig Dispense Refill  . aspirin 81 MG tablet Take 81 mg by mouth daily.    . Azelastine HCl 0.15 % SOLN Place 2 sprays into the nose 2 (two) times daily. 30 mL 0  . CIALIS 20 MG tablet TAKE 1 TABLET BY MOUTH AS NEEDED AS DIRECTED 106 tablet 0  . fluticasone (FLONASE) 50 MCG/ACT nasal spray Place 2 sprays into both nostrils daily. 16 g 11  . lisinopril (PRINIVIL,ZESTRIL) 40 MG tablet Take 1 tablet (40 mg total) by mouth daily. 90 tablet 3  . Multiple Vitamin (MULTIVITAMIN WITH MINERALS) TABS Take 1 tablet by mouth daily.    Marland Kitchen atorvastatin (LIPITOR) 40 MG tablet Take 1 tablet (40 mg total) by mouth at bedtime. (Patient not taking: Reported on 12/29/2014) 90 tablet 3  . cephALEXin (KEFLEX) 500 MG capsule Take 1 capsule (500 mg total) by mouth 3 (three) times daily. (Patient not taking: Reported on 12/29/2014) 15 capsule 0  . cetirizine (ZYRTEC) 10 MG tablet Take 1 tablet (10 mg total) by mouth daily. (Patient not taking: Reported on 12/29/2014) 30 tablet 11  . zoster vaccine live, PF, (ZOSTAVAX) 42683 UNT/0.65ML injection Inject 19,400 Units into the skin once. (Patient not taking: Reported on 09/12/2014) 1 each 0   No facility-administered medications prior to visit.    ROS Review of Systems  Constitutional: Negative for fever, chills and appetite  change.  HENT: Positive for congestion, ear pain, postnasal drip, rhinorrhea, sinus pressure and sore throat.   Eyes: Negative for pain and redness.  Respiratory: Negative for cough, shortness of breath and wheezing.   Cardiovascular: Negative for leg swelling.  Gastrointestinal: Negative for nausea, vomiting, abdominal pain, diarrhea, constipation and blood in stool.  Endocrine: Negative for polyuria.  Genitourinary: Negative for dysuria, urgency, frequency and flank pain.  Musculoskeletal: Negative for gait problem.  Skin: Negative for rash.  Neurological: Negative for weakness and headaches.  Psychiatric/Behavioral: Negative for confusion and decreased concentration. The patient is not nervous/anxious.     Objective:  BP 110/66 mmHg  Pulse 73  Temp(Src) 98.4 F (36.9 C) (Oral)  Resp 16  Ht 5' 10.5" (1.791 m)  Wt 237 lb 6.4 oz (107.684 kg)  BMI 33.57 kg/m2  SpO2 98%  BP Readings from Last 3 Encounters:  12/29/14 110/66  09/14/14 110/64  09/12/14 100/60    Wt Readings from Last 3 Encounters:  12/29/14 237 lb 6.4 oz (107.684 kg)  09/14/14 232 lb (105.235 kg)  09/08/14 232 lb 6.4 oz (105.416 kg)    Physical Exam  Constitutional: He is oriented to person, place, and time. He appears well-developed and well-nourished. No distress.  HENT:  Head: Normocephalic and atraumatic.  Right Ear: There is tenderness. No drainage or swelling.  Left Ear: External ear normal.  Nose: Rhinorrhea present.  Eyes: Conjunctivae and  EOM are normal. Pupils are equal, round, and reactive to light. No scleral icterus.  Neck: Normal range of motion. Neck supple. No tracheal deviation present.  Cardiovascular: Normal rate, regular rhythm and normal heart sounds.   Pulmonary/Chest: Effort normal. No respiratory distress. He has no wheezes. He has no rales.  Abdominal: He exhibits no mass. There is no tenderness. There is no rebound and no guarding.  Musculoskeletal: He exhibits no edema.    Lymphadenopathy:    He has no cervical adenopathy.  Neurological: He is alert and oriented to person, place, and time. Coordination normal.  Skin: Skin is warm and dry. No rash noted.  Psychiatric: He has a normal mood and affect. His behavior is normal.    Lab Results  Component Value Date   WBC 8.9 06/30/2014   HGB 16.5 06/30/2014   HCT 48.2 06/30/2014   PLT 215 06/30/2014   GLUCOSE 75 06/30/2014   CHOL 179 06/30/2014   TRIG 289* 06/30/2014   HDL 38* 06/30/2014   LDLCALC 83 06/30/2014   ALT 22 06/30/2014   AST 25 06/30/2014   NA 138 06/30/2014   K 4.5 06/30/2014   CL 104 06/30/2014   CREATININE 1.07 06/30/2014   BUN 23 06/30/2014   CO2 25 06/30/2014   TSH 0.868 04/28/2013   PSA <0.01 06/30/2014      Assessment & Plan:   Deagan was seen today for neck pain and ear pain.  Diagnoses and all orders for this visit:  Otitis, externa, infective, right  Acute pansinusitis, recurrence not specified  Other orders -     neomycin-polymyxin-hydrocortisone (CORTISPORIN) 3.5-10000-1 otic suspension; Place 4 drops into the right ear 4 (four) times daily. -     amoxicillin-clavulanate (AUGMENTIN) 875-125 MG per tablet; Take 1 tablet by mouth 2 (two) times daily. -     pseudoephedrine-guaifenesin (MUCINEX D) 60-600 MG per tablet; Take 1 tablet by mouth every 12 (twelve) hours.  I am having Mr. Leverich start on neomycin-polymyxin-hydrocortisone, amoxicillin-clavulanate, and pseudoephedrine-guaifenesin. I am also having him maintain his aspirin, multivitamin with minerals, CIALIS, fluticasone, atorvastatin, lisinopril, zoster vaccine live (PF), cetirizine, Azelastine HCl, and cephALEXin.  Meds ordered this encounter  Medications  . neomycin-polymyxin-hydrocortisone (CORTISPORIN) 3.5-10000-1 otic suspension    Sig: Place 4 drops into the right ear 4 (four) times daily.    Dispense:  10 mL    Refill:  0  . amoxicillin-clavulanate (AUGMENTIN) 875-125 MG per tablet    Sig: Take 1  tablet by mouth 2 (two) times daily.    Dispense:  20 tablet    Refill:  0  . pseudoephedrine-guaifenesin (MUCINEX D) 60-600 MG per tablet    Sig: Take 1 tablet by mouth every 12 (twelve) hours.    Dispense:  18 tablet    Refill:  0     Follow-up: Return if symptoms worsen or fail to improve.  Roselee Culver, MD

## 2014-12-29 NOTE — Patient Instructions (Signed)

## 2015-01-06 ENCOUNTER — Ambulatory Visit (INDEPENDENT_AMBULATORY_CARE_PROVIDER_SITE_OTHER): Payer: BLUE CROSS/BLUE SHIELD | Admitting: Emergency Medicine

## 2015-01-06 ENCOUNTER — Ambulatory Visit (INDEPENDENT_AMBULATORY_CARE_PROVIDER_SITE_OTHER): Payer: BLUE CROSS/BLUE SHIELD

## 2015-01-06 VITALS — BP 124/70 | HR 69 | Temp 97.9°F | Resp 17 | Wt 232.0 lb

## 2015-01-06 DIAGNOSIS — M542 Cervicalgia: Secondary | ICD-10-CM

## 2015-01-06 MED ORDER — PREDNISONE 10 MG PO TABS: ORAL_TABLET | ORAL | Status: DC

## 2015-01-06 MED ORDER — CYCLOBENZAPRINE HCL 10 MG PO TABS: ORAL_TABLET | ORAL | Status: DC

## 2015-01-06 NOTE — Patient Instructions (Signed)

## 2015-01-06 NOTE — Progress Notes (Deleted)
   Subjective:    Patient ID: Nicholas Wallace, male    DOB: 1951/05/23, 64 y.o.   MRN: [id number]  HPI    Review of Systems     Objective:   Physical Exam        Assessment & Plan:

## 2015-01-06 NOTE — Progress Notes (Signed)
   Subjective:    Patient ID: Nicholas Wallace, male    DOB: [birth year], 64 y.o.   MRN: [id number]  HPI    I scribed this chart for Dr Nicholas Wallace in his presence, Nicholas Wallace  Pt recently treated for otitis externa. Put on Augmentin, Cortisporin, mucinex. Now complaining of left sided neck pain. NKI. It states neck was hurting when he came in for ear infection. Positive for neck surgery 15-20 years ago but can't remember exactly what was done. Denies pain radiating down arms.    Review of Systems patient was seen recently with an ear infection on the right. He was treated with Nicholas Wallace biotics and anabolic drops and this has improved.    Objective:   Physical Exam  Constitutional: He appears well-developed.  HENT:  Head: Normocephalic.  Eyes: Pupils are equal, round, and reactive to light.  Neck: No thyromegaly present.  Cardiovascular: Normal rate and regular rhythm.   Musculoskeletal:  There is tenderness over the left pericervical muscles. There is decreased range of motion when patient extends his neck and turns to the left or the right. He has no weakness of the upper extremities reflexes are 2+ and symmetrical.   [facility] reading (PRIMARY) by  Dr Nicholas Wallace there is minimal degenerative disc disease with some narrowing of the foramina no major changes.        Assessment & Plan:  Patient presents with left-sided neck pain. Will treat with a prednisone taper along with Flexeril at night recheck if symptoms are persistent.I personally performed the services described in this documentation, which was scribed in my presence. The recorded information has been reviewed and is accurate.  Nicholas Jordan, MD

## 2015-01-21 ENCOUNTER — Other Ambulatory Visit: Payer: Self-pay | Admitting: Physician Assistant

## 2015-03-22 ENCOUNTER — Ambulatory Visit (INDEPENDENT_AMBULATORY_CARE_PROVIDER_SITE_OTHER): Payer: BLUE CROSS/BLUE SHIELD | Admitting: Emergency Medicine

## 2015-03-22 VITALS — BP 136/74 | HR 64 | Temp 97.8°F | Resp 16 | Ht 70.0 in | Wt 231.0 lb

## 2015-03-22 DIAGNOSIS — M545 Low back pain: Secondary | ICD-10-CM

## 2015-03-22 DIAGNOSIS — C61 Malignant neoplasm of prostate: Secondary | ICD-10-CM | POA: Diagnosis not present

## 2015-03-22 LAB — POCT URINALYSIS DIPSTICK
Bilirubin, UA: NEGATIVE
Blood, UA: NEGATIVE
GLUCOSE UA: NEGATIVE
Ketones, UA: NEGATIVE
Leukocytes, UA: NEGATIVE
Nitrite, UA: NEGATIVE
Protein, UA: 30
Spec Grav, UA: >=1.03
UROBILINOGEN UA: 0.2
pH, UA: 5

## 2015-03-22 LAB — POCT UA - MICROSCOPIC ONLY
CASTS, UR, LPF, POC: NEGATIVE
Crystals, Ur, HPF, POC: NEGATIVE
Yeast, UA: NEGATIVE

## 2015-03-22 MED ORDER — NAPROXEN SODIUM 550 MG PO TABS: 550.0000 mg | ORAL_TABLET | Freq: Two times a day (BID) | ORAL | Status: DC

## 2015-03-22 NOTE — Patient Instructions (Signed)

## 2015-03-22 NOTE — Progress Notes (Signed)
Subjective:  Patient ID: Nicholas Wallace, male    DOB: [birth year]  Age: 64 y.o. MRN: [id number]  CC: Follow-up   HPI Nicholas Wallace presents  with back pain. He was treated previously with prednisone and muscle relaxant for left posterior shoulder base the neck pain. He said that has resolved now he is complaining of pain in his prominently left low back. But across his back. Eyes any numbness tingling or radiation of pain. No weakness. No history of injury he said no improvement pain with over-the-counter medication.  He has a history of prostate cancer as requested a PSA. He's not having any symptoms referable to the GU tract.   History Nicholas Wallace has a past medical history of Abnormal EKG (02/19/2012); Allergy; Cancer; Hypertension; and Blood transfusion without reported diagnosis.   He has past surgical history that includes Back surgery (1985); Prostatectomy; Lymphadenectomy (2009); Lumbar laminectomy; and cervical kidney stones.   His  family history includes Alzheimer's disease in his mother; Cancer in his paternal grandmother; Heart disease in his maternal grandmother.  He   reports that he has never smoked. He has never used smokeless tobacco. He reports that he drinks alcohol. He reports that he does not use illicit drugs.  Outpatient Prescriptions Prior to Visit  Medication Sig Dispense Refill  . aspirin 81 MG tablet Take 81 mg by mouth daily.    . Azelastine HCl 0.15 % SOLN Place 2 sprays into the nose 2 (two) times daily. 30 mL 0  . CIALIS 20 MG tablet TAKE 1 TABLET BY MOUTH AS NEEDED AS DIRECTED (Patient not taking: Reported on 03/22/2015) 106 tablet 0  . cyclobenzaprine (FLEXERIL) 10 MG tablet Take 1 tablet at night 10 tablet 0  . fluticasone (FLONASE) 50 MCG/ACT nasal spray Place 2 sprays into both nostrils daily. 16 g 11  . lisinopril (PRINIVIL,ZESTRIL) 40 MG tablet Take 1 tablet (40 mg total) by mouth daily. 90 tablet 3  . Multiple Vitamin (MULTIVITAMIN WITH MINERALS)  TABS Take 1 tablet by mouth daily.    . predniSONE (DELTASONE) 10 MG tablet Take 4 day for 3 days 3 a day for 3 days 2 a day for 3 days one a day for 3 days 30 tablet 0   No facility-administered medications prior to visit.    Social History   Social History  . Marital Status: Married    Spouse Name: [name]  . Number of Children: 6  . 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 Smoking status: Never Smoker   . Smokeless tobacco: Never Used  . Alcohol Use: 0.0 - 0.5 oz/week    0-1 drink(s) per week  . Drug Use: No  . Sexual Activity: Yes    Birth Control/ Protection: None   Other Topics Concern  . None   Social History Narrative   Lives with his wife and their daughter.  They each have children from previous relationships.     Review of Systems  Constitutional: Negative for fever, chills and appetite change.  HENT: Negative for congestion, ear pain, postnasal drip, sinus pressure and sore throat.   Eyes: Negative for pain and redness.  Respiratory: Negative for cough, shortness of breath and wheezing.   Cardiovascular: Negative for leg swelling.  Gastrointestinal: Negative for nausea, vomiting, abdominal pain, diarrhea, constipation and blood in stool.  Endocrine: Negative for polyuria.  Genitourinary: Negative for dysuria, urgency,  frequency and flank pain.  Musculoskeletal: Positive for back pain. Negative for gait problem.  Skin: Negative for rash.  Neurological: Negative for weakness and headaches.  Psychiatric/Behavioral: Negative for confusion and decreased concentration. The patient is not nervous/anxious.     Objective:  BP 136/74 mmHg  Pulse 64  Temp(Src) 97.8 F (36.6 C) (Oral)  Resp 16  Ht 5\' 10"  (1.778 m)  Wt 231 lb (104.781 kg)  BMI 33.15 kg/m2  Physical Exam  Constitutional: He is oriented to person, place, and time. He appears  well-developed and well-nourished.  HENT:  Head: Normocephalic and atraumatic.  Eyes: Conjunctivae are normal. Pupils are equal, round, and reactive to light.  Pulmonary/Chest: Effort normal.  Musculoskeletal: He exhibits no edema.  Neurological: He is alert and oriented to person, place, and time.  Skin: Skin is dry.  Psychiatric: He has a normal mood and affect. His behavior is normal. Thought content normal.      Assessment & Plan:   Sufian was seen today for follow-up.  Diagnoses and all orders for this visit:  Low back pain without sciatica, unspecified back pain laterality -     POCT UA - Microscopic Only -     POCT urinalysis dipstick  Malignant neoplasm of prostate -     PSA  Other orders -     naproxen sodium (ANAPROX DS) 550 MG tablet; Take 1 tablet (550 mg total) by mouth 2 (two) times daily with a meal.   I have discontinued Nicholas Wallace's predniSONE. I am also having him start on naproxen sodium. Additionally, I am having him maintain his aspirin, multivitamin with minerals, CIALIS, fluticasone, lisinopril, Azelastine HCl, and cyclobenzaprine.  Meds ordered this encounter  Medications  . naproxen sodium (ANAPROX DS) 550 MG tablet    Sig: Take 1 tablet (550 mg total) by mouth 2 (two) times daily with a meal.    Dispense:  40 tablet    Refill:  0    Appropriate red flag conditions were discussed with the patient as well as actions that should be taken.  Patient expressed his understanding.  Follow-up: Return if symptoms worsen or fail to improve.  Roselee Culver, MD

## 2015-03-23 LAB — PSA: PSA: <0.01 ng/mL (ref <=4.00)

## 2015-05-11 ENCOUNTER — Encounter: Payer: Self-pay | Admitting: Emergency Medicine

## 2015-05-18 ENCOUNTER — Ambulatory Visit (INDEPENDENT_AMBULATORY_CARE_PROVIDER_SITE_OTHER): Payer: BLUE CROSS/BLUE SHIELD | Admitting: Physician Assistant

## 2015-05-18 VITALS — BP 140/100 | HR 69 | Temp 98.3°F | Resp 16 | Ht 72.0 in | Wt 235.0 lb

## 2015-05-18 DIAGNOSIS — E785 Hyperlipidemia, unspecified: Secondary | ICD-10-CM

## 2015-05-18 DIAGNOSIS — Z23 Encounter for immunization: Secondary | ICD-10-CM

## 2015-05-18 DIAGNOSIS — H04203 Unspecified epiphora, bilateral lacrimal glands: Secondary | ICD-10-CM | POA: Diagnosis not present

## 2015-05-18 DIAGNOSIS — J069 Acute upper respiratory infection, unspecified: Secondary | ICD-10-CM | POA: Diagnosis not present

## 2015-05-18 DIAGNOSIS — R0981 Nasal congestion: Secondary | ICD-10-CM | POA: Diagnosis not present

## 2015-05-18 DIAGNOSIS — I10 Essential (primary) hypertension: Secondary | ICD-10-CM

## 2015-05-18 LAB — LIPID PANEL
Cholesterol: 239 mg/dL — ABNORMAL HIGH (ref 125–200)
HDL: 35 mg/dL — ABNORMAL LOW (ref >=40)
LDL Cholesterol: 161 mg/dL — ABNORMAL HIGH (ref <130)
TRIGLYCERIDES: 215 mg/dL — AB (ref <150)
Total CHOL/HDL Ratio: 6.8 Ratio — ABNORMAL HIGH (ref <=5.0)
VLDL: 43 mg/dL — ABNORMAL HIGH (ref <30)

## 2015-05-18 LAB — COMPLETE METABOLIC PANEL WITH GFR
ALT: 18 U/L (ref 9–46)
AST: 16 U/L (ref 10–35)
Albumin: 4.3 g/dL (ref 3.6–5.1)
Alkaline Phosphatase: 61 U/L (ref 40–115)
BILIRUBIN TOTAL: 0.6 mg/dL (ref 0.2–1.2)
BUN: 9 mg/dL (ref 7–25)
CO2: 25 mmol/L (ref 20–31)
Calcium: 9.4 mg/dL (ref 8.6–10.3)
Chloride: 106 mmol/L (ref 98–110)
Creat: 0.87 mg/dL (ref 0.70–1.25)
GFR, Est Non African American: >89 mL/min (ref >=60)
GLUCOSE: 116 mg/dL — AB (ref 65–99)
Potassium: 4.7 mmol/L (ref 3.5–5.3)
Sodium: 140 mmol/L (ref 135–146)
Total Protein: 6.5 g/dL (ref 6.1–8.1)

## 2015-05-18 MED ORDER — GUAIFENESIN ER 1200 MG PO TB12: 1.0000 | ORAL_TABLET | Freq: Two times a day (BID) | ORAL | Status: DC | PRN

## 2015-05-18 MED ORDER — CETIRIZINE HCL 10 MG PO TABS: 10.0000 mg | ORAL_TABLET | Freq: Every day | ORAL | Status: AC

## 2015-05-18 MED ORDER — ATORVASTATIN CALCIUM 40 MG PO TABS: 40.0000 mg | ORAL_TABLET | Freq: Every day | ORAL | Status: DC

## 2015-05-18 MED ORDER — IPRATROPIUM BROMIDE 0.03 % NA SOLN: 2.0000 | Freq: Two times a day (BID) | NASAL | Status: DC

## 2015-05-18 NOTE — Patient Instructions (Addendum)
Please take the medication as prescribed.  Please hydrate as much as possible, drinking 64 oz of water per day (almost 4 regular sized water bottles).   You can use delsym for cough.  You can try the zyrtec to treat possible allergies and watery eyes.    If you are not feeling any improvement in the next 5 days, then please contact me.    Upper Respiratory Infection, Adult Most upper respiratory infections (URIs) are a viral infection of the air passages leading to the lungs. A URI affects the nose, throat, and upper air passages. The most common type of URI is nasopharyngitis and is typically referred to as "the common cold." URIs run their course and usually go away on their own. Most of the time, a URI does not require medical attention, but sometimes a bacterial infection in the upper airways can follow a viral infection. This is called a secondary infection. Sinus and middle ear infections are common types of secondary upper respiratory infections. Bacterial pneumonia can also complicate a URI. A URI can worsen asthma and chronic obstructive pulmonary disease (COPD). Sometimes, these complications can require emergency medical care and may be life threatening.  CAUSES Almost all URIs are caused by viruses. A virus is a type of germ and can spread from one person to another.  RISKS FACTORS You may be at risk for a URI if:   You smoke.   You have chronic heart or lung disease.  You have a weakened defense (immune) system.   You are very young or very old.   You have nasal allergies or asthma.  You work in crowded or poorly ventilated areas.  You work in health care facilities or schools. SIGNS AND SYMPTOMS  Symptoms typically develop 2-3 days after you come in contact with a cold virus. Most viral URIs last 7-10 days. However, viral URIs from the influenza virus (flu virus) can last 14-18 days and are typically more severe. Symptoms may include:   Runny or stuffy (congested) nose.    Sneezing.   Cough.   Sore throat.   Headache.   Fatigue.   Fever.   Loss of appetite.   Pain in your forehead, behind your eyes, and over your cheekbones (sinus pain).  Muscle aches.  DIAGNOSIS  Your health care provider may diagnose a URI by:  Physical exam.  Tests to check that your symptoms are not due to another condition such as:  Strep throat.  Sinusitis.  Pneumonia.  Asthma. TREATMENT  A URI goes away on its own with time. It cannot be cured with medicines, but medicines may be prescribed or recommended to relieve symptoms. Medicines may help:  Reduce your fever.  Reduce your cough.  Relieve nasal congestion. HOME CARE INSTRUCTIONS   Take medicines only as directed by your health care provider.   Gargle warm saltwater or take cough drops to comfort your throat as directed by your health care provider.  Use a warm mist humidifier or inhale steam from a shower to increase air moisture. This may make it easier to breathe.  Drink enough fluid to keep your urine clear or pale yellow.   Eat soups and other clear broths and maintain good nutrition.   Rest as needed.   Return to work when your temperature has returned to normal or as your health care provider advises. You may need to stay home longer to avoid infecting others. You can also use a face mask and careful hand washing to  prevent spread of the virus.  Increase the usage of your inhaler if you have asthma.   Do not use any tobacco products, including cigarettes, chewing tobacco, or electronic cigarettes. If you need help quitting, ask your health care provider. PREVENTION  The best way to protect yourself from getting a cold is to practice good hygiene.   Avoid oral or hand contact with people with cold symptoms.   Wash your hands often if contact occurs.  There is no clear evidence that vitamin C, vitamin E, echinacea, or exercise reduces the chance of developing a cold.  However, it is always recommended to get plenty of rest, exercise, and practice good nutrition.  SEEK MEDICAL CARE IF:   You are getting worse rather than better.   Your symptoms are not controlled by medicine.   You have chills.  You have worsening shortness of breath.  You have brown or red mucus.  You have yellow or brown nasal discharge.  You have pain in your face, especially when you bend forward.  You have a fever.  You have swollen neck glands.  You have pain while swallowing.  You have white areas in the back of your throat. SEEK IMMEDIATE MEDICAL CARE IF:   You have severe or persistent:  Headache.  Ear pain.  Sinus pain.  Chest pain.  You have chronic lung disease and any of the following:  Wheezing.  Prolonged cough.  Coughing up blood.  A change in your usual mucus.  You have a stiff neck.  You have changes in your:  Vision.  Hearing.  Thinking.  Mood. MAKE SURE YOU:   Understand these instructions.  Will watch your condition.  Will get help right away if you are not doing well or get worse.   This information is not intended to replace advice given to you by your health care provider. Make sure you discuss any questions you have with your health care provider.   Document Released: 01/17/2001 Document Revised: 12/08/2014 Document Reviewed: 10/29/2013 Elsevier Interactive Patient Education [employer].

## 2015-05-18 NOTE — Progress Notes (Signed)
[facility] [address] [place] [address] [phone]  Date:  05/18/2015   Name:  Nicholas Wallace   DOB:  1950/09/30   MRN:  [id number]  PCP:  Jenny Reichmann, MD    History of Present Illness:  Nicholas Wallace is a 64 y.o. male patient who presents to [facility] for sinus pressure, sore throat, rhinorrhea, and watery eyes for 2 days.  He has no fever.  Mucus is not thick.  He has very little non-productive cough.  No dyspnea, or sob.  He uses his flonase daily.    He has used only tylenol for pain.   HL: No chest pains, palpitations, leg swelling or sob.  He has been out of the atorvastatin for about a month, but is generally compliant.      Patient Active Problem List   Diagnosis Date Noted  . Abnormal EKG   . MALIGNANT NEOPLASM OF PROSTATE 06/04/2008  . DYSLIPIDEMIA 06/04/2008  . ESSENTIAL HYPERTENSION, BENIGN 06/04/2008    Past Medical History  Diagnosis Date  . Abnormal EKG 02/19/2012  . Allergy   . Cancer ([place])   . Hypertension   . Blood transfusion without reported diagnosis     Past Surgical History  Procedure Laterality Date  . Back surgery  1985    [facility]  . Prostatectomy    . Lymphadenectomy  2009    pelvic lymphadenectomy by Dr. Irine Seal  . Lumbar laminectomy    . Cervical kidney stones      Social History  Substance Use Topics  . Smoking status: Never Smoker   . Smokeless tobacco: Never Used  . Alcohol Use: 0.0 - 0.5 oz/week    0-1 drink(s) per week    Family History  Problem Relation Age of Onset  . Alzheimer's disease Mother   . Heart disease Maternal Grandmother   . Cancer Paternal Grandmother     No Known Allergies  Medication list has been reviewed and updated.  Current Outpatient Prescriptions on File Prior to Visit  Medication Sig Dispense Refill  . aspirin 81 MG tablet Take 81 mg by mouth daily.    . Azelastine HCl 0.15 % SOLN Place 2 sprays into the nose 2 (two) times daily. 30 mL 0  . CIALIS 20 MG tablet TAKE 1  TABLET BY MOUTH AS NEEDED AS DIRECTED 106 tablet 0  . fluticasone (FLONASE) 50 MCG/ACT nasal spray Place 2 sprays into both nostrils daily. 16 g 11  . lisinopril (PRINIVIL,ZESTRIL) 40 MG tablet Take 1 tablet (40 mg total) by mouth daily. 90 tablet 3  . Multiple Vitamin (MULTIVITAMIN WITH MINERALS) TABS Take 1 tablet by mouth daily.     No current facility-administered medications on file prior to visit.    ROS ROS otherwise unremarkable unless listed above.   Physical Examination: BP 140/100 mmHg  Pulse 69  Temp(Src) 98.3 F (36.8 C) (Oral)  Resp 16  Ht 6' (1.829 m)  Wt 235 lb (106.595 kg)  BMI 31.86 kg/m2  SpO2 98% Ideal Body Weight: Weight in (lb) to have BMI = 25: 183.9  Physical Exam  Constitutional: He is oriented to person, place, and time. He appears well-developed and well-nourished. No distress.  HENT:  Head: Atraumatic.  Right Ear: Tympanic membrane, external ear and ear canal normal.  Left Ear: Tympanic membrane, external ear and ear canal normal.  Nose: Mucosal edema and rhinorrhea present. Right sinus exhibits no maxillary sinus tenderness and no frontal sinus tenderness. Left  sinus exhibits no maxillary sinus tenderness and no frontal sinus tenderness.  Mouth/Throat: No uvula swelling. No oropharyngeal exudate, posterior oropharyngeal edema or posterior oropharyngeal erythema.  Eyes: Conjunctivae, EOM and lids are normal. Pupils are equal, round, and reactive to light. Right eye exhibits normal extraocular motion. Left eye exhibits normal extraocular motion.  Neck: Trachea normal and full passive range of motion without pain. No edema and no erythema present.  Cardiovascular: Normal rate.   Pulmonary/Chest: Effort normal. No respiratory distress. He has no decreased breath sounds. He has no wheezes. He has no rhonchi.  Neurological: He is alert and oriented to person, place, and time.  Skin: Skin is warm and dry. He is not diaphoretic.  Psychiatric: He has a normal  mood and affect. His behavior is normal.     Assessment and Plan: Nicholas Wallace is a 64 y.o. male who is here today nasal pressure and sore throat.  Likely viral at this time and will treat supportively.  Given otc delsym and cepacol at this time. If symptoms continue after 5 days, advised to call me, and we can discuss further treatment unless alarming sxs are present. -Refill of atorvastatin at this time.  Lipid panel taken today along with cmetgfr. -I have advised him to return in Late November/december for annual physical exam. -future order placed for flu vaccine given present illness.  Advised to return after illness.     Upper respiratory infection - Plan: Guaifenesin (MUCINEX MAXIMUM STRENGTH) 1200 MG TB12, ipratropium (ATROVENT) 0.03 % nasal spray, cetirizine (ZYRTEC) 10 MG tablet  Hyperlipidemia - Plan: Lipid panel, atorvastatin (LIPITOR) 40 MG tablet  Essential hypertension, benign - Plan: Lipid panel, COMPLETE METABOLIC PANEL WITH GFR  Need for influenza vaccination - Plan: Flu Vaccine QUAD 36+ mos IM  Nasal congestion - Plan: cetirizine (ZYRTEC) 10 MG tablet  Watery eyes - Plan: cetirizine (ZYRTEC) 10 MG tablet    Ivar Drape, PA-C [facility] 05/18/2015 8:26 AM

## 2015-05-19 ENCOUNTER — Telehealth: Payer: Self-pay

## 2015-05-19 NOTE — Telephone Encounter (Signed)
PT is wanting to have a return call from [place] -he would not go into details

## 2015-05-19 NOTE — Telephone Encounter (Signed)
Called and spoke with pt, he would like a prescription for amoxicillin. States symptoms are not clearing up.

## 2015-05-20 ENCOUNTER — Ambulatory Visit (INDEPENDENT_AMBULATORY_CARE_PROVIDER_SITE_OTHER): Payer: BLUE CROSS/BLUE SHIELD | Admitting: Family Medicine

## 2015-05-20 VITALS — BP 128/84 | HR 78 | Temp 98.8°F | Ht 69.75 in | Wt 234.0 lb

## 2015-05-20 DIAGNOSIS — R05 Cough: Secondary | ICD-10-CM | POA: Diagnosis not present

## 2015-05-20 DIAGNOSIS — E1169 Type 2 diabetes mellitus with other specified complication: Secondary | ICD-10-CM

## 2015-05-20 DIAGNOSIS — R059 Cough, unspecified: Secondary | ICD-10-CM

## 2015-05-20 DIAGNOSIS — J3489 Other specified disorders of nose and nasal sinuses: Secondary | ICD-10-CM | POA: Diagnosis not present

## 2015-05-20 DIAGNOSIS — J0140 Acute pansinusitis, unspecified: Secondary | ICD-10-CM | POA: Diagnosis not present

## 2015-05-20 DIAGNOSIS — E785 Hyperlipidemia, unspecified: Secondary | ICD-10-CM

## 2015-05-20 MED ORDER — AZITHROMYCIN 250 MG PO TABS: ORAL_TABLET | ORAL | Status: DC

## 2015-05-20 MED ORDER — HYDROCOD POLST-CPM POLST ER 10-8 MG/5ML PO SUER: 5.0000 mL | Freq: Two times a day (BID) | ORAL | Status: DC | PRN

## 2015-05-20 NOTE — Progress Notes (Signed)
Chief Complaint:  Chief Complaint  Patient presents with  . Nasal Congestion    x 4 days   . Follow-up    pt seen Tues / 2 days ago.  States not getting better  . Headache  . Generalized Body Aches    HPI: Nicholas Wallace is a 64 y.o. male who reports to [facility] today complaining of  Frontal and Maxillary HA, generalized body aches x 6 days, sinus pressure, cough, runny nose. He has tried otc meds .  No improvement, allergy meds did not improve sxs either, was here on 10/11 for similar sxs just getting worse He has not taken cholesterol meds for 6 months, labs reviewed with patient  Did not get flu injection on last visit.   Past Medical History  Diagnosis Date  . Abnormal EKG 02/19/2012  . Allergy   . Cancer ([place])   . Hypertension   . Blood transfusion without reported diagnosis    Past Surgical History  Procedure Laterality Date  . Back surgery  1985    [facility]  . Prostatectomy    . Lymphadenectomy  2009    pelvic lymphadenectomy by Dr. Irine Seal  . Lumbar laminectomy    . Cervical kidney stones     Social History   Social History  . Marital Status: Married    Spouse Name: Nicholas Wallace  . Number of Children: 6  . 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 Smoking status: Never Smoker   . Smokeless tobacco: Never Used  . Alcohol Use: 0.0 - 0.5 oz/week    0-1 drink(s) per week  . Drug Use: No  . Sexual Activity: Yes    Birth Control/ Protection: None   Other Topics Concern  . None   Social History Narrative   Lives with his wife and their daughter.  They each have children from previous relationships.   Family History  Problem Relation Age of Onset  . Alzheimer's disease Mother   . Heart disease Maternal Grandmother   . Cancer Paternal Grandmother    No Known Allergies Prior to Admission medications   Medication Sig Start Date End Date Taking?  Authorizing Provider  aspirin 81 MG tablet Take 81 mg by mouth daily.   Yes Historical Provider, MD  atorvastatin (LIPITOR) 40 MG tablet Take 1 tablet (40 mg total) by mouth at bedtime. 05/18/15  Yes Stephanie D English, PA  Azelastine HCl 0.15 % SOLN Place 2 sprays into the nose 2 (two) times daily. 08/04/14  Yes Shawnee Knapp, MD  cetirizine (ZYRTEC) 10 MG tablet Take 1 tablet (10 mg total) by mouth daily. 05/18/15  Yes Stephanie D English, PA  CIALIS 20 MG tablet TAKE 1 TABLET BY MOUTH AS NEEDED AS DIRECTED 05/16/14  Yes Darlyne Russian, MD  Guaifenesin [facility] MAXIMUM STRENGTH) 1200 MG TB12 Take 1 tablet (1,200 mg total) by mouth every 12 (twelve) hours as needed. 05/18/15  Yes Stephanie D English, PA  ipratropium (ATROVENT) 0.03 % nasal spray Place 2 sprays into both nostrils 2 (two) times daily. 05/18/15  Yes Stephanie D English, PA  lisinopril (PRINIVIL,ZESTRIL) 40 MG tablet Take 1 tablet (40 mg total) by mouth daily. 06/30/14  Yes Darlyne Russian, MD  Multiple Vitamin (MULTIVITAMIN WITH MINERALS) TABS Take 1 tablet by mouth daily.   Yes Historical Provider, MD  fluticasone (FLONASE) 50 MCG/ACT  nasal spray Place 2 sprays into both nostrils daily. Patient not taking: Reported on 05/20/2015 06/30/14   Darlyne Russian, MD     ROS: The patient denies night sweats, unintentional weight loss, chest pain, palpitations, wheezing, dyspnea on exertion, nausea, vomiting, abdominal pain, dysuria, hematuria, melena, numbness, weakness, or tingling.  All other systems have been reviewed and were otherwise negative with the exception of those mentioned in the HPI and as above.    PHYSICAL EXAM: Filed Vitals:   05/20/15 0820  BP: 128/84  Pulse: 78  Temp: 98.8 F (37.1 C)   Body mass index is 33.8 kg/(m^2).   General: Alert, no acute distress HEENT:  Normocephalic, atraumatic, oropharynx patent. EOMI, PERRLA Erythematous throat, no exudates, TM normal, + sinus tenderness, + erythematous/boggy nasal  mucosa Cardiovascular:  Regular rate and rhythm, no rubs murmurs or gallops.  No Carotid bruits, radial pulse intact. No pedal edema.  Respiratory: Clear to auscultation bilaterally.  No wheezes, rales, or rhonchi.  No cyanosis, no use of accessory musculature Abdominal: No organomegaly, abdomen is soft and non-tender, positive bowel sounds. No masses. Skin: No rashes. Neurologic: Facial musculature symmetric. Psychiatric: Patient acts appropriately throughout our interaction. Lymphatic: No cervical or submandibular lymphadenopathy Musculoskeletal: Gait intact. No edema, tenderness   LABS: Results for orders placed or performed in visit on 05/18/15  Lipid panel  Result Value Ref Range   Cholesterol 239 (H) 125 - 200 mg/dL   Triglycerides 215 (H) <150 mg/dL   HDL 35 (L) >=40 mg/dL   Total CHOL/HDL Ratio 6.8 (H) <=5.0 Ratio   VLDL 43 (H) <30 mg/dL   LDL Cholesterol 161 (H) <130 mg/dL  COMPLETE METABOLIC PANEL WITH GFR  Result Value Ref Range   Sodium 140 135 - 146 mmol/L   Potassium 4.7 3.5 - 5.3 mmol/L   Chloride 106 98 - 110 mmol/L   CO2 25 20 - 31 mmol/L   Glucose, Bld 116 (H) 65 - 99 mg/dL   BUN 9 7 - 25 mg/dL   Creat 0.87 0.70 - 1.25 mg/dL   Total Bilirubin 0.6 0.2 - 1.2 mg/dL   Alkaline Phosphatase 61 40 - 115 U/L   AST 16 10 - 35 U/L   ALT 18 9 - 46 U/L   Total Protein 6.5 6.1 - 8.1 g/dL   Albumin 4.3 3.6 - 5.1 g/dL   Calcium 9.4 8.6 - 10.3 mg/dL   GFR, Est African American >89 >=60 mL/min   GFR, Est Non African American >89 >=60 mL/min     EKG/XRAY:   Primary read interpreted by Dr. Marin Comment at [facility].   ASSESSMENT/PLAN: Encounter Diagnoses  Name Primary?  . Acute pansinusitis, recurrence not specified Yes  . Rhinorrhea   . Hyperlipidemia due to type 2 diabetes mellitus ([place])   . Cough    Rx zpack Rx Tussionex Cont with nasal sprays Fu prn  Otherwise in 6 months for recheck of lipids, needs to be compliant with meds.   Gross sideeffects, risk and benefits, and  alternatives of medications d/w patient. Patient is aware that all medications have potential sideeffects and we are unable to predict every sideeffect or drug-drug interaction that may occur.  Riyan Gavina DO  05/20/2015 9:01 AM

## 2015-05-20 NOTE — Patient Instructions (Signed)

## 2015-05-21 NOTE — Telephone Encounter (Signed)
Colletta [place] called and spoke to me about pt while she was out of the office traveling. She had tried to call the pt but couldn't get an answer, and can not document the attempt from her phone. She asked me to document that she had tried to reach pt. I advised her that Dr Marin Comment saw the pt on 10/13 and Rxd a Z pak, so pt is taken care of.

## 2015-05-26 ENCOUNTER — Other Ambulatory Visit: Payer: Self-pay | Admitting: Physician Assistant

## 2015-05-26 DIAGNOSIS — E785 Hyperlipidemia, unspecified: Secondary | ICD-10-CM

## 2015-05-26 MED ORDER — ROSUVASTATIN CALCIUM 10 MG PO TABS: 10.0000 mg | ORAL_TABLET | Freq: Every day | ORAL | Status: AC

## 2015-05-26 NOTE — Progress Notes (Signed)
Spoke with patient who stated that he restarted the atorvastatin after getting the results of his elevated cholesterol.  He had been off due to lack of refill, he thinks.  Does not report side effects.  Advised that we are going to now discontinue the atorvastatin, and start on the Crestor 10 mg.  We will recheck in 3 months.

## 2015-06-23 ENCOUNTER — Ambulatory Visit (INDEPENDENT_AMBULATORY_CARE_PROVIDER_SITE_OTHER): Payer: BLUE CROSS/BLUE SHIELD | Admitting: Emergency Medicine

## 2015-06-23 ENCOUNTER — Ambulatory Visit (INDEPENDENT_AMBULATORY_CARE_PROVIDER_SITE_OTHER): Payer: BLUE CROSS/BLUE SHIELD

## 2015-06-23 VITALS — BP 122/80 | HR 63 | Temp 99.0°F | Resp 16 | Ht 70.5 in | Wt 239.0 lb

## 2015-06-23 DIAGNOSIS — M545 Low back pain, unspecified: Secondary | ICD-10-CM

## 2015-06-23 DIAGNOSIS — Z23 Encounter for immunization: Secondary | ICD-10-CM | POA: Diagnosis not present

## 2015-06-23 MED ORDER — PREDNISONE 10 MG PO TABS: ORAL_TABLET | ORAL | Status: DC

## 2015-06-23 NOTE — Progress Notes (Signed)
This chart was scribed for Nicholas Queen, MD by Moises Blood, Medical Scribe. This patient was seen in Room 4 and the patient's care was started 8:55 AM.  Chief Complaint:  Chief Complaint  Patient presents with  . Leg Pain    Left leg and hip x 2 months  . Flu Vaccine    HPI: Nicholas Wallace is a 64 y.o. male who reports to [facility] today complaining of right leg and buttocks for 2 months. He thought it was a sprain but it hasn't resolved. The pain radiates up and down his leg. It hurts for him to lay down to sleep. He had back surgery 1985.   He also requests a flu vaccine.   Past Medical History  Diagnosis Date  . Abnormal EKG 02/19/2012  . Allergy   . Cancer ([place])   . Hypertension   . Blood transfusion without reported diagnosis    Past Surgical History  Procedure Laterality Date  . Back surgery  1985    [facility]  . Prostatectomy    . Lymphadenectomy  2009    pelvic lymphadenectomy by Dr. Irine Seal  . Lumbar laminectomy    . Cervical kidney stones     Social History   Social History  . Marital Status: Married    Spouse Name: Nicholas Wallace  . Number of Children: 6  . 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 Smoking status: Never Smoker   . Smokeless tobacco: Never Used  . Alcohol Use: 0.0 - 0.5 oz/week    0-1 drink(s) per week  . Drug Use: No  . Sexual Activity: Yes    Birth Control/ Protection: None   Other Topics Concern  . None   Social History Narrative   Lives with his wife and their daughter.  They each have children from previous relationships.   Family History  Problem Relation Age of Onset  . Alzheimer's disease Mother   . Heart disease Maternal Grandmother   . Cancer Paternal Grandmother    No Known Allergies Prior to Admission medications   Medication Sig Start Date End Date Taking? Authorizing Provider  aspirin 81 MG tablet Take 81 mg by  mouth daily.   Yes Historical Provider, MD  Azelastine HCl 0.15 % SOLN Place 2 sprays into the nose 2 (two) times daily. 08/04/14  Yes Shawnee Knapp, MD  cetirizine (ZYRTEC) 10 MG tablet Take 1 tablet (10 mg total) by mouth daily. 05/18/15  Yes Stephanie D English, PA  CIALIS 20 MG tablet TAKE 1 TABLET BY MOUTH AS NEEDED AS DIRECTED 05/16/14  Yes Darlyne Russian, MD  fluticasone (FLONASE) 50 MCG/ACT nasal spray Place 2 sprays into both nostrils daily. 06/30/14  Yes Darlyne Russian, MD  ipratropium (ATROVENT) 0.03 % nasal spray Place 2 sprays into both nostrils 2 (two) times daily. 05/18/15  Yes Stephanie D English, PA  lisinopril (PRINIVIL,ZESTRIL) 40 MG tablet Take 1 tablet (40 mg total) by mouth daily. 06/30/14  Yes Darlyne Russian, MD  Multiple Vitamin (MULTIVITAMIN WITH MINERALS) TABS Take 1 tablet by mouth daily.   Yes Historical Provider, MD  rosuvastatin (CRESTOR) 10 MG tablet Take 1 tablet (10 mg total) by mouth daily. 05/26/15  Yes Stephanie D English, PA  atorvastatin (LIPITOR) 40 MG tablet Take 1 tablet (40 mg total) by mouth at bedtime. Patient not taking: Reported on 06/23/2015 05/18/15  Dorian Heckle English, PA     ROS:  Constitutional: negative for chills, fever, night sweats, weight changes, or fatigue  HEENT: negative for vision changes, hearing loss, congestion, rhinorrhea, ST, epistaxis, or sinus pressure Cardiovascular: negative for chest pain or palpitations Respiratory: negative for hemoptysis, wheezing, shortness of breath, or cough Abdominal: negative for abdominal pain, nausea, vomiting, diarrhea, or constipation Dermatological: negative for rash Neurologic: negative for headache, dizziness, or syncope Musc: positive for leg pain (right), hip pain (right)  All other systems reviewed and are otherwise negative with the exception to those above and in the HPI.  PHYSICAL EXAM: Filed Vitals:   06/23/15 0831  BP: 122/80  Pulse: 63  Temp: 99 F (37.2 C)  Resp: 16   Body  mass index is 33.8 kg/(m^2).   General: Alert, no acute distress HEENT:  Normocephalic, atraumatic, oropharynx patent. Eye: Juliette Mangle [facility] Cardiovascular:  Regular rate and rhythm, no rubs murmurs or gallops.  No Carotid bruits, radial pulse intact. No pedal edema.  Respiratory: Clear to auscultation bilaterally.  No wheezes, rales, or rhonchi.  No cyanosis, no use of accessory musculature Abdominal: No organomegaly, abdomen is soft and non-tender, positive bowel sounds. No masses. Musculoskeletal: Gait intact. No edema, tenderness L5 - S1; positive seated straight leg raise 80 degrees, equal str 5/5 bilaterally in lower extremities Skin: No rashes. Neurologic: Facial musculature symmetric; patellar reflexes 2+, achilles reflexes 2+,  Psychiatric: Patient acts appropriately throughout our interaction.  Lymphatic: No cervical or submandibular lymphadenopathy Genitourinary/Anorectal: No acute findings   LABS:    EKG/XRAY:   Primary read interpreted by [facility]lene Farrier at [facility]: xray back: degenerative arthritis, degenerative spur L2 L3, rule out pars defected L5-S1   ASSESSMENT/PLAN: Patient with sciatica right leg. He is placed on a prednisone taper. MRI of the back if no improvement.  By signing my name below, I, Moises Blood, attest that this documentation has been prepared under the direction and in the presence of Nicholas Queen, MD. Electronically Signed: Moises Blood, [place]. 06/23/2015 , 8:55 AM . I personally performed the services described in this documentation, which was scribed in my presence. The recorded information has been reviewed and is accurate.   Gross sideeffects, risk and benefits, and alternatives of medications d/w patient. Patient is aware that all medications have potential sideeffects and we are unable to predict every sideeffect or drug-drug interaction that may occur.  Nicholas Queen MD 06/23/2015 8:55 AM

## 2015-06-23 NOTE — Patient Instructions (Signed)

## 2015-07-06 ENCOUNTER — Encounter: Payer: Self-pay | Admitting: Emergency Medicine

## 2015-07-06 ENCOUNTER — Ambulatory Visit (INDEPENDENT_AMBULATORY_CARE_PROVIDER_SITE_OTHER): Payer: BLUE CROSS/BLUE SHIELD | Admitting: Emergency Medicine

## 2015-07-06 VITALS — BP 130/84 | HR 72 | Temp 98.2°F | Resp 16 | Ht 71.0 in | Wt 238.0 lb

## 2015-07-06 DIAGNOSIS — M5431 Sciatica, right side: Secondary | ICD-10-CM | POA: Insufficient documentation

## 2015-07-06 DIAGNOSIS — IMO0001 Reserved for inherently not codable concepts without codable children: Secondary | ICD-10-CM

## 2015-07-06 DIAGNOSIS — Z1159 Encounter for screening for other viral diseases: Secondary | ICD-10-CM

## 2015-07-06 DIAGNOSIS — B351 Tinea unguium: Secondary | ICD-10-CM

## 2015-07-06 DIAGNOSIS — Z23 Encounter for immunization: Secondary | ICD-10-CM

## 2015-07-06 DIAGNOSIS — Z0000 Encounter for general adult medical examination without abnormal findings: Secondary | ICD-10-CM | POA: Diagnosis not present

## 2015-07-06 DIAGNOSIS — C61 Malignant neoplasm of prostate: Secondary | ICD-10-CM

## 2015-07-06 DIAGNOSIS — R030 Elevated blood-pressure reading, without diagnosis of hypertension: Secondary | ICD-10-CM

## 2015-07-06 DIAGNOSIS — E785 Hyperlipidemia, unspecified: Secondary | ICD-10-CM

## 2015-07-06 LAB — CBC WITH DIFFERENTIAL/PLATELET
Basophils Absolute: 0 10*3/uL (ref 0.0–0.1)
Basophils Relative: 0 % (ref 0–1)
Eosinophils Absolute: 0.2 10*3/uL (ref 0.0–0.7)
Eosinophils Relative: 2 % (ref 0–5)
HEMATOCRIT: 47.9 % (ref 39.0–52.0)
HEMOGLOBIN: 16 g/dL (ref 13.0–17.0)
LYMPHS ABS: 2.8 10*3/uL (ref 0.7–4.0)
LYMPHS PCT: 32 % (ref 12–46)
MCH: 31.3 pg (ref 26.0–34.0)
MCHC: 33.4 g/dL (ref 30.0–36.0)
MCV: 93.6 fL (ref 78.0–100.0)
MONO ABS: 1.1 10*3/uL — AB (ref 0.1–1.0)
MPV: 10.2 fL (ref 8.6–12.4)
Monocytes Relative: 13 % — ABNORMAL HIGH (ref 3–12)
NEUTROS ABS: 4.6 10*3/uL (ref 1.7–7.7)
NEUTROS PCT: 53 % (ref 43–77)
Platelets: 193 10*3/uL (ref 150–400)
RBC: 5.12 MIL/uL (ref 4.22–5.81)
RDW: 13.6 % (ref 11.5–15.5)
WBC: 8.7 10*3/uL (ref 4.0–10.5)

## 2015-07-06 LAB — POC MICROSCOPIC URINALYSIS (UMFC): Mucus: ABSENT

## 2015-07-06 LAB — COMPLETE METABOLIC PANEL WITH GFR
ALT: 24 U/L (ref 9–46)
AST: 19 U/L (ref 10–35)
Albumin: 4.1 g/dL (ref 3.6–5.1)
Alkaline Phosphatase: 54 U/L (ref 40–115)
BILIRUBIN TOTAL: 0.7 mg/dL (ref 0.2–1.2)
BUN: 17 mg/dL (ref 7–25)
CO2: 26 mmol/L (ref 20–31)
Calcium: 9.1 mg/dL (ref 8.6–10.3)
Chloride: 108 mmol/L (ref 98–110)
Creat: 0.89 mg/dL (ref 0.70–1.25)
GFR, Est African American: >89 mL/min (ref >=60)
GLUCOSE: 117 mg/dL — AB (ref 65–99)
Potassium: 4.4 mmol/L (ref 3.5–5.3)
SODIUM: 140 mmol/L (ref 135–146)
TOTAL PROTEIN: 6.5 g/dL (ref 6.1–8.1)

## 2015-07-06 LAB — POCT URINALYSIS DIP (MANUAL ENTRY)
BILIRUBIN UA: NEGATIVE
Bilirubin, UA: NEGATIVE
Blood, UA: NEGATIVE
Glucose, UA: NEGATIVE
Leukocytes, UA: NEGATIVE
Nitrite, UA: NEGATIVE
PH UA: 5.5
PROTEIN UA: NEGATIVE
SPEC GRAV UA: 1.025
Urobilinogen, UA: 0.2

## 2015-07-06 LAB — HEPATITIS C ANTIBODY: HCV AB: NEGATIVE

## 2015-07-06 LAB — LIPID PANEL
CHOL/HDL RATIO: 3.3 ratio (ref <=5.0)
Cholesterol: 173 mg/dL (ref 125–200)
HDL: 52 mg/dL (ref >=40)
LDL CALC: 93 mg/dL (ref <130)
Triglycerides: 142 mg/dL (ref <150)
VLDL: 28 mg/dL (ref <30)

## 2015-07-06 MED ORDER — CICLOPIROX 8 % EX SOLN: Freq: Every day | CUTANEOUS | Status: AC

## 2015-07-06 NOTE — Progress Notes (Signed)
This chart was scribed for Arlyss Queen, MD by Moises Blood, Medical Scribe. This patient was seen in Room 21 and the patient's care was started 8:29 AM.  Chief Complaint:  Chief Complaint  Patient presents with  . Annual Exam    HPI: Nicholas Wallace is a 64 y.o. male who reports to [facility] today for annual physical.  Right Leg He's taken the prednisone but his right leg is still bothering him. In xray readings, there is mild DDD center at L2-3. There is facet joint hypertrophy at L5-S1. No acute lumbar spine abnl. MRI of lumbar spine may be useful.   He had back surgery back in 1985.   Allergies He has seasonal allergies.   Cancer Screening Colonoscopy: last year or year before, normal He denies smoking. He only smoked a little when he was 34.  He denies having follow up on his prostate.   Immunizations He received a flu shot last visit, Nov 16th.  He has yet to receive a prevnar.   Vision He saw his eye doctor last week.   Cardiology He had an abnl EKG, back in 2013.   Past Medical History  Diagnosis Date  . Abnormal EKG 02/19/2012  . Allergy   . Cancer ([place])   . Hypertension   . Blood transfusion without reported diagnosis    Past Surgical History  Procedure Laterality Date  . Back surgery  1985    [facility]  . Prostatectomy    . Lymphadenectomy  2009    pelvic lymphadenectomy by Dr. Irine Seal  . Lumbar laminectomy    . Cervical kidney stones     Social History   Social History  . Marital Status: Married    Spouse Name: [name]  . Number of Children: 6  . 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 Smoking status: Never Smoker   . Smokeless tobacco: Never Used  . Alcohol Use: 0.0 - 0.5 oz/week    0-1 drink(s) per week  . Drug Use: No  . Sexual Activity: Yes    Birth Control/ Protection: None   Other Topics Concern  . Not on file   Social  History Narrative   Lives with his wife and their daughter.  They each have children from previous relationships.   Family History  Problem Relation Age of Onset  . Alzheimer's disease Mother   . Heart disease Maternal Grandmother   . Cancer Paternal Grandmother    No Known Allergies Prior to Admission medications   Medication Sig Start Date End Date Taking? Authorizing Provider  aspirin 81 MG tablet Take 81 mg by mouth daily.    Historical Provider, MD  atorvastatin (LIPITOR) 40 MG tablet Take 1 tablet (40 mg total) by mouth at bedtime. Patient not taking: Reported on 06/23/2015 05/18/15   Dorian Heckle English, PA  Azelastine HCl 0.15 % SOLN Place 2 sprays into the nose 2 (two) times daily. 08/04/14   Shawnee Knapp, MD  cetirizine (ZYRTEC) 10 MG tablet Take 1 tablet (10 mg total) by mouth daily. 05/18/15   Dorian Heckle English, PA  CIALIS 20 MG tablet TAKE 1 TABLET BY MOUTH AS NEEDED AS DIRECTED 05/16/14   Darlyne Russian, MD  fluticasone (FLONASE) 50 MCG/ACT nasal spray Place 2 sprays into both nostrils daily. 06/30/14   Darlyne Russian, MD  ipratropium (ATROVENT) 0.03 % nasal  spray Place 2 sprays into both nostrils 2 (two) times daily. 05/18/15   Dorian Heckle English, PA  lisinopril (PRINIVIL,ZESTRIL) 40 MG tablet Take 1 tablet (40 mg total) by mouth daily. 06/30/14   Darlyne Russian, MD  Multiple Vitamin (MULTIVITAMIN WITH MINERALS) TABS Take 1 tablet by mouth daily.    Historical Provider, MD  predniSONE (DELTASONE) 10 MG tablet Take 4 a  day for 3 days 3 a day for 3 days 2 a day for 3 days one a day for 3 days 06/23/15   Darlyne Russian, MD  rosuvastatin (CRESTOR) 10 MG tablet Take 1 tablet (10 mg total) by mouth daily. 05/26/15   Dorian Heckle English, PA     ROS:  Constitutional: negative for chills, fever, night sweats, weight changes, or fatigue  HEENT: negative for vision changes, hearing loss, congestion, rhinorrhea, ST, epistaxis, or sinus pressure Cardiovascular: negative for chest pain  or palpitations Respiratory: negative for hemoptysis, wheezing, shortness of breath, or cough Abdominal: negative for abdominal pain, nausea, vomiting, diarrhea, or constipation Dermatological: negative for rash Neurologic: negative for headache, dizziness, or syncope Musc: positive for right leg pain, right hip pain All other systems reviewed and are otherwise negative with the exception to those above and in the HPI.  PHYSICAL EXAM: Filed Vitals:   07/06/15 0818  BP: 130/84  Pulse: 72  Temp: 98.2 F (36.8 C)  Resp: 16   Body mass index is 33.21 kg/(m^2).   General: Alert, no acute distress HEENT:  Normocephalic, atraumatic, oropharynx patent. Eye: Juliette Mangle [facility] Cardiovascular:  Regular rate and rhythm, no rubs murmurs or gallops.  No Carotid bruits, radial pulse intact. No pedal edema.  Respiratory: Clear to auscultation bilaterally.  No wheezes, rales, or rhonchi.  No cyanosis, no use of accessory musculature Abdominal: No organomegaly, abdomen is soft and non-tender, positive bowel sounds. No masses. Musculoskeletal: Gait intact. Scar over C spine, L-S spine Skin: No rashes. Neurologic: Facial musculature symmetric. Psychiatric: Patient acts appropriately throughout our interaction.  Lymphatic: No cervical or submandibular lymphadenopathy Genitourinary/Anorectal: surgically absent prostate; no masses   LABS:    EKG/XRAY:   Primary read interpreted by [facility] Everlene Farrier at [facility]. EKG shows sinus bradycardia with borderline LVH.   ASSESSMENT/PLAN: 1. Annual physical exam He was updated with his Prevnar vaccine. He is on he had his flu shot. His physical examination was good except as relates to his back and sciatica right leg.  2. Prostate cancer ([place]) This has been stable with no recurrence. - CBC with Differential/Platelet - POCT urinalysis dipstick - POCT Microscopic Urinalysis ([facility]) - PSA, Medicare  3. Sciatica of right side  - Ambulatory referral to Orthopedic  Surgery patient would prefer to see Dr. Louanne Skye - MR Lumbar Spine W Contrast; Future  4. Elevated blood pressure He will continue his blood pressure medications. - CBC with Differential/Platelet - COMPLETE METABOLIC PANEL WITH GFR - EKG 12-Lead  5. Need for prophylactic vaccination against Streptococcus pneumoniae (pneumococcus) Prevnar given.  6. Hyperlipidemia - COMPLETE METABOLIC PANEL WITH GFR - Lipid panel  7. Need for hepatitis C screening test  - Hepatitis C antibody  8. Onychomycosis  - ciclopirox (PENLAC) 8 % solution; Apply topically at bedtime. Apply over nail and surrounding skin. Apply daily over previous coat. After seven (7) days, may remove with alcohol and continue cycle.  Dispense: 6.6 mL; Refill: 2   I personally performed the services described in this documentation, which was scribed in my presence. The recorded information has been reviewed  and is accurate.  Arlyss Queen, MD  [facility]  07/06/2015 10:01 AM  By signing my name below, I, Moises Blood, attest that this documentation has been prepared under the direction and in the presence of Arlyss Queen, MD. Electronically Signed: Moises Blood, [place]. 07/06/2015 , 8:29 AM .    [place] sideeffects, risk and benefits, and alternatives of medications d/w patient. Patient is aware that all medications have potential sideeffects and we are unable to predict every sideeffect or drug-drug interaction that may occur.  Arlyss Queen MD 07/06/2015 8:29 AM

## 2015-07-07 LAB — PSA, MEDICARE: PSA: <0.01 ng/mL (ref <=4.00)

## 2015-07-27 ENCOUNTER — Ambulatory Visit (HOSPITAL_COMMUNITY)
Admission: RE | Admit: 2015-07-27 | Discharge: 2015-07-27 | Disposition: A | Discharge status: Home / Self Care | Payer: BLUE CROSS/BLUE SHIELD | Source: Ambulatory Visit | Attending: Emergency Medicine | Admitting: Emergency Medicine

## 2015-07-27 DIAGNOSIS — M4806 Spinal stenosis, lumbar region: Secondary | ICD-10-CM | POA: Insufficient documentation

## 2015-07-27 DIAGNOSIS — M5431 Sciatica, right side: Secondary | ICD-10-CM | POA: Insufficient documentation

## 2015-07-27 DIAGNOSIS — R202 Paresthesia of skin: Secondary | ICD-10-CM | POA: Insufficient documentation

## 2015-07-27 DIAGNOSIS — R200 Anesthesia of skin: Secondary | ICD-10-CM | POA: Insufficient documentation

## 2015-07-27 DIAGNOSIS — M545 Low back pain: Secondary | ICD-10-CM | POA: Insufficient documentation

## 2015-07-27 DIAGNOSIS — M5136 Other intervertebral disc degeneration, lumbar region: Secondary | ICD-10-CM | POA: Insufficient documentation

## 2015-07-27 MED ORDER — GADOBENATE DIMEGLUMINE 529 MG/ML IV SOLN
20.0000 mL | Freq: Once | INTRAVENOUS | Status: AC | PRN
  Administered 2015-07-27: 20 mL via INTRAVENOUS

## 2015-07-28 ENCOUNTER — Telehealth: Payer: Self-pay

## 2015-07-28 NOTE — Telephone Encounter (Signed)
Call patient I will check on results and call him tomorrow.

## 2015-07-28 NOTE — Telephone Encounter (Signed)
Patient is calling to let DR. Daub know that he had an MRI last night at [facility]

## 2015-07-29 ENCOUNTER — Other Ambulatory Visit: Payer: Self-pay | Admitting: Emergency Medicine

## 2015-07-29 DIAGNOSIS — M519 Unspecified thoracic, thoracolumbar and lumbosacral intervertebral disc disorder: Secondary | ICD-10-CM

## 2015-07-29 NOTE — Telephone Encounter (Signed)
Spoke with pt, advised imaging results. He states he has already seen Dr. Lorin Mercy and wonders if you want him to see him for this. I advised we could fax his report over there to him. Is Dr. Lorin Mercy a neurosurgeon?

## 2015-07-29 NOTE — Telephone Encounter (Signed)
Please cancel the referral to neurosurgery. It would be fine for him to see Dr. Lorin Mercy who is an orthopedic surgeon. It appears there is a good chance he will need to have surgery on his back. Does he need me to make the referral to Dr. Lorin Mercy?

## 2015-07-30 NOTE — Telephone Encounter (Signed)
Pt advised. He has an appt with [facility] today. He will decide who he wants to see after consult.

## 2015-09-08 ENCOUNTER — Other Ambulatory Visit: Payer: Self-pay

## 2015-09-08 MED ORDER — LISINOPRIL 40 MG PO TABS: 40.0000 mg | ORAL_TABLET | Freq: Every day | ORAL | Status: DC

## 2015-09-09 ENCOUNTER — Other Ambulatory Visit: Payer: Self-pay | Admitting: Emergency Medicine

## 2015-09-10 NOTE — Telephone Encounter (Signed)
Patient was just seen did not see this in notes.  Can we refill?

## 2016-04-06 ENCOUNTER — Telehealth: Payer: Self-pay

## 2016-04-06 NOTE — Telephone Encounter (Signed)
PA completed on covermymeds for Cialis 20 for ED. Checked pt's med list and looks like he was Rxd Viagra once in the past. Pending.

## 2016-04-11 NOTE — Telephone Encounter (Signed)
PA approved through 08/06/2038. Notified pharm. 

## 2016-06-05 ENCOUNTER — Other Ambulatory Visit: Payer: Self-pay | Admitting: Emergency Medicine

## 2016-06-05 ENCOUNTER — Other Ambulatory Visit: Payer: Self-pay | Admitting: Physician Assistant

## 2016-06-05 DIAGNOSIS — E785 Hyperlipidemia, unspecified: Secondary | ICD-10-CM

## 2016-07-03 ENCOUNTER — Other Ambulatory Visit: Payer: Self-pay | Admitting: Emergency Medicine

## 2016-08-03 ENCOUNTER — Ambulatory Visit (INDEPENDENT_AMBULATORY_CARE_PROVIDER_SITE_OTHER): Payer: BLUE CROSS/BLUE SHIELD | Admitting: Family Medicine

## 2016-08-03 VITALS — BP 132/72 | HR 67 | Temp 98.5°F | Ht 71.0 in | Wt 227.9 lb

## 2016-08-03 DIAGNOSIS — E669 Obesity, unspecified: Secondary | ICD-10-CM

## 2016-08-03 DIAGNOSIS — E782 Mixed hyperlipidemia: Secondary | ICD-10-CM | POA: Diagnosis not present

## 2016-08-03 DIAGNOSIS — Z23 Encounter for immunization: Secondary | ICD-10-CM | POA: Diagnosis not present

## 2016-08-03 DIAGNOSIS — I10 Essential (primary) hypertension: Secondary | ICD-10-CM

## 2016-08-03 MED ORDER — ATORVASTATIN CALCIUM 40 MG PO TABS: 40.0000 mg | ORAL_TABLET | Freq: Every day | ORAL | 1 refills | Status: AC

## 2016-08-03 MED ORDER — LISINOPRIL 40 MG PO TABS: 40.0000 mg | ORAL_TABLET | Freq: Every day | ORAL | 1 refills | Status: DC

## 2016-08-03 NOTE — Progress Notes (Signed)
Chief Complaint  Patient presents with  . Medication Refill    Lisinorpril and Crestor    HPI   Hypertension: Patient here for follow-up of elevated blood pressure. He is exercising and is adherent to low salt diet.  Blood pressure is well controlled at home. Denies chest pain, chest pressure/discomfort, claudication, dyspnea, fatigue, lower extremity edema and palpitations. Cardiovascular risk factors: advanced age (older than 77 for men, 50 for women), dyslipidemia, hypertension and male gender. Use of agents associated with hypertension: none. History of target organ damage: none.   He plans to get his eye check up this year. BP Readings from Last 3 Encounters:  08/03/16 132/72  07/06/15 130/84  06/23/15 122/80   He reports that he has been out of his medication lisinopril for a couple of weeks now.  He states that he takes lisinopril 40mg  daily.  Hyperlipidemia: Patient presents with hyperlipidemia.  He was tested because of screening.   negative. There is a family history of hyperlipidemia. There is a family history of early ischemia heart disease in his grandparents.  Denies side effects from his medication.  Lab Results  Component Value Date   CHOL 173 07/06/2015   CHOL 239 (H) 05/18/2015   CHOL 179 06/30/2014   Lab Results  Component Value Date   HDL 52 07/06/2015   HDL 35 (L) 05/18/2015   HDL 38 (L) 06/30/2014   Lab Results  Component Value Date   LDLCALC 93 07/06/2015   LDLCALC 161 (H) 05/18/2015   LDLCALC 83 06/30/2014   Lab Results  Component Value Date   TRIG 142 07/06/2015   TRIG 215 (H) 05/18/2015   TRIG 289 (H) 06/30/2014   Lab Results  Component Value Date   CHOLHDL 3.3 07/06/2015   CHOLHDL 6.8 (H) 05/18/2015   CHOLHDL 4.7 06/30/2014   No results found for: LDLDIRECT   Obesity Pt reports that he has been doing portion control to help his cholesterol and blood pressure He has a gym membership and goes a few times per week He avoids empty  calories in beverages  Body mass index is 31.79 kg/m.  Wt Readings from Last 3 Encounters:  08/03/16 227 lb 14.4 oz (103.4 kg)  07/27/15 238 lb (108 kg)  07/06/15 238 lb (108 kg)    Past Medical History:  Diagnosis Date  . Abnormal EKG 02/19/2012  . Allergy   . Blood transfusion without reported diagnosis   . Cancer ([place])   . Hypertension     Current Outpatient Prescriptions  Medication Sig Dispense Refill  . aspirin 81 MG tablet Take 81 mg by mouth daily.    Marland Kitchen atorvastatin (LIPITOR) 40 MG tablet Take 1 tablet (40 mg total) by mouth at bedtime. 90 tablet 1  . Azelastine HCl 0.15 % SOLN Place 2 sprays into the nose 2 (two) times daily. 30 mL 0  . cetirizine (ZYRTEC) 10 MG tablet Take 1 tablet (10 mg total) by mouth daily. 30 tablet 5  . CIALIS 20 MG tablet TAKE 1 TABLET BY MOUTH AS NEEDED AS DIRECTED 106 tablet 0  . ciclopirox (PENLAC) 8 % solution Apply topically at bedtime. Apply over nail and surrounding skin. Apply daily over previous coat. After seven (7) days, may remove with alcohol and continue cycle. 6.6 mL 2  . fluticasone (FLONASE) 50 MCG/ACT nasal spray Place 2 sprays into both nostrils daily. 16 g 11  . lisinopril (PRINIVIL,ZESTRIL) 40 MG tablet Take 1 tablet (40 mg total) by mouth daily. 90 tablet  1  . Multiple Vitamin (MULTIVITAMIN WITH MINERALS) TABS Take 1 tablet by mouth daily.    . rosuvastatin (CRESTOR) 10 MG tablet Take 1 tablet (10 mg total) by mouth daily. 90 tablet 3   No current facility-administered medications for this visit.     Allergies: No Known Allergies  Past Surgical History:  Procedure Laterality Date  . [place]  . cervical kidney stones    . LUMBAR LAMINECTOMY    . LYMPHADENECTOMY  2009   pelvic lymphadenectomy by Dr. Irine Seal  . PROSTATECTOMY      Social History   Social History  . Marital status: Married    Spouse name: [name]  . Number of children: 6  . 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 Smoking status: Never Smoker  . Smokeless tobacco: Never Used  . Alcohol use 0.0 - 0.5 oz/week  . Drug use: No  . Sexual activity: Yes    Birth control/ protection: None   Other Topics Concern  . None   Social History Narrative   Lives with his wife and their daughter.  They each have children from previous relationships.    ROS  Objective: Vitals:   08/03/16 1412  BP: 132/72  Pulse: 67  Temp: 98.5 F (36.9 C)  TempSrc: Oral  SpO2: 96%  Weight: 227 lb 14.4 oz (103.4 kg)  Height: 5\' 11"  (1.803 m)    Physical Exam  Constitutional: He is oriented to person, place, and time. He appears well-developed and well-nourished.  HENT:  Head: Normocephalic and atraumatic.  Right Ear: External ear normal.  Left Ear: External ear normal.  Eyes: Conjunctivae and EOM are normal.  Neck: Normal range of motion. Neck supple.  Cardiovascular: Normal rate, regular rhythm and normal heart sounds.   Pulmonary/Chest: Effort normal and breath sounds normal. No respiratory distress. He has no wheezes.  Musculoskeletal: Normal range of motion. He exhibits no edema.  Neurological: He is alert and oriented to person, place, and time.  Skin: Skin is warm. Capillary refill takes less than 2 seconds.  Psychiatric: He has a normal mood and affect. His behavior is normal. Judgment and thought content normal.    Assessment and Plan Tan was seen today for medication refill.  Diagnoses and all orders for this visit:  Need for prophylactic vaccination and inoculation against influenza -     Flu Vaccine QUAD 36+ mos IM  Mixed hyperlipidemia- will check liver panel Will assess lipids Compliant with meds Advised increased exercise and fiber -     Comprehensive metabolic panel -     Lipid panel -     atorvastatin (LIPITOR) 40 MG tablet; Take 1 tablet (40 mg  total) by mouth at bedtime.  Essential hypertension, benign- will decrease bp dose if bp gets below [id number] systolic -     Comprehensive metabolic panel -     Microalbumin, urine  Obesity, mild- continue regular exercise -     Hemoglobin A1c  Other orders -     lisinopril (PRINIVIL,ZESTRIL) 40 MG tablet; Take 1 tablet (40 mg total) by mouth daily.     [place]

## 2016-08-03 NOTE — Patient Instructions (Addendum)
   IF you received an x-ray today, you will receive an invoice from [facility]. Please contact  Radiology at [phone] with questions or concerns regarding your invoice.   IF you received labwork today, you will receive an invoice from [facility]. Please contact [facility] at [phone] with questions or concerns regarding your invoice.   Our billing staff will not be able to assist you with questions regarding bills from these companies.  You will be contacted with the lab results as soon as they are available. The fastest way to get your results is to activate your My Chart account. Instructions are located on the last page of this paperwork. If you have not heard from us regarding the results in 2 weeks, please contact this office.      Managing Your Hypertension Hypertension is commonly called high blood pressure. Blood pressure is a measurement of how strongly your blood is pressing against the walls of your arteries. Arteries are blood vessels that carry blood from your heart throughout your body. Blood pressure does not stay the same. It rises when you are active, excited, or nervous. It lowers when you are sleeping or relaxed. If the numbers that measure your blood pressure stay above normal most of the time, you are at risk for health problems. Hypertension is a long-term (chronic) condition in which blood pressure is elevated. This condition often has no signs or symptoms. The cause of the condition is usually not known. What are blood pressure readings? A blood pressure reading is recorded as two numbers, such as "120 over 80" (or 120/80). The first ("top") number is called the systolic pressure. It is a measure of the pressure in your arteries as the heart beats. The second ("bottom") number is called the diastolic pressure. It is a measure of the pressure in your arteries as the heart relaxes between beats. What does my blood pressure reading mean? Blood  pressure is classified into four stages. Based on your blood pressure reading, your health care provider may use the following stages to determine what type of treatment, if any, is needed. Systolic pressure and diastolic pressure are measured in a unit called mm Hg. Normal  Systolic pressure: below 120.  Diastolic pressure: below 80. Prehypertension  Systolic pressure: 120-139.  Diastolic pressure: 80-89. Hypertension stage 1  Systolic pressure: 140-159.  Diastolic pressure: 90-99. Hypertension stage 2  Systolic pressure: 160 or above.  Diastolic pressure: 100 or above. What health risks are associated with hypertension? Managing your hypertension is an important responsibility. Uncontrolled hypertension can lead to:  A heart attack.  A stroke.  A weakened blood vessel (aneurysm).  Heart failure.  Kidney damage.  Eye damage.  Metabolic syndrome.  Memory and concentration problems. What changes can I make to manage my hypertension? Hypertension can be managed effectively by making lifestyle changes and possibly by taking medicines. Your health care provider will help you come up with a plan to bring your blood pressure within a normal range. Your plan should include the following: Monitoring  Monitor your blood pressure at home as told by your health care provider. Your personal target blood pressure may vary depending on your medical conditions, your age, and other factors.  Have your blood pressure rechecked as told by your health care provider. Lifestyle  Lose weight if necessary.  Get at least 30-45 minutes of aerobic exercise at least 4 times a week.  Do not use any products that contain nicotine or tobacco, such as cigarettes and   e-cigarettes. If you need help quitting, ask your health care provider.  Learn ways to reduce stress.  Control any chronic conditions, such as high cholesterol or diabetes. Eating and drinking  Follow the DASH diet. This diet  is high in fruits, vegetables, and whole grains. It is low in salt, red meat, and added sugars.  Keep your sodium intake below 2,300 mg per day.  Limit alcoholic beverages. Communication  Review all the medicines you take with your health care provider because there may be side effects or interactions.  Talk with your health care provider about your diet, exercise habits, and other lifestyle factors that may be contributing to hypertension.  See your health care provider regularly. Your health care provider can help you create and adjust your plan for managing hypertension. Will I need medicine to control my blood pressure? Your health care provider may prescribe medicine if lifestyle changes are not enough to get your blood pressure under control, and if one of the following is true:  You are 18-59 years of age, and your systolic blood pressure is 140 or higher.  You are 60 years of age or older, and your systolic blood pressure is 150 or higher.  Your diastolic blood pressure is 90 or higher.  You have diabetes, and your systolic blood pressure is over 140 or your diastolic blood pressure is over 90.  You have kidney disease, and your blood pressure is above 140/90.  You have heart disease or a history of stroke, and your blood pressure is 140/90 or higher. Take medicines only as told by your health care provider. Follow the directions carefully. Blood pressure medicines must be taken as prescribed. The medicine does not work as well when you skip doses. Skipping doses also puts you at risk for problems. Contact a health care provider if:  You think you are having a reaction to medicines you have taken.  You have repeated (recurrent) headaches.  You feel dizzy.  You have swelling in your ankles.  You have trouble with your vision. Get help right away if:  You develop a severe headache or confusion.  You have unusual weakness or numbness, or you feel faint.  You have  severe pain in your chest or abdomen.  You vomit repeatedly.  You have trouble breathing. This information is not intended to replace advice given to you by your health care provider. Make sure you discuss any questions you have with your health care provider. Document Released: 04/17/2012 Document Revised: 03/28/2016 Document Reviewed: 10/22/2015 Elsevier Interactive Patient Education  2017 [employer] Inc.  

## 2016-08-04 LAB — COMPREHENSIVE METABOLIC PANEL
A/G RATIO: 1.8 (ref 1.2–2.2)
ALBUMIN: 4.4 g/dL (ref 3.6–4.8)
ALT: 25 IU/L (ref 0–44)
AST: 22 IU/L (ref 0–40)
Alkaline Phosphatase: 64 IU/L (ref 39–117)
BUN / CREAT RATIO: 12 (ref 10–24)
BUN: 11 mg/dL (ref 8–27)
Bilirubin Total: 0.5 mg/dL (ref 0.0–1.2)
CALCIUM: 9.3 mg/dL (ref 8.6–10.2)
CHLORIDE: 103 mmol/L (ref 96–106)
CO2: 25 mmol/L (ref 18–29)
CREATININE: 0.92 mg/dL (ref 0.76–1.27)
GFR calc Af Amer: 100 mL/min/{1.73_m2} (ref >59)
GFR calc non Af Amer: 86 mL/min/{1.73_m2} (ref >59)
GLOBULIN, TOTAL: 2.4 g/dL (ref 1.5–4.5)
Glucose: 91 mg/dL (ref 65–99)
Potassium: 4.1 mmol/L (ref 3.5–5.2)
Sodium: 143 mmol/L (ref 134–144)
TOTAL PROTEIN: 6.8 g/dL (ref 6.0–8.5)

## 2016-08-04 LAB — LIPID PANEL
CHOLESTEROL TOTAL: 246 mg/dL — AB (ref 100–199)
Chol/HDL Ratio: 6.2 ratio units — ABNORMAL HIGH (ref 0.0–5.0)
HDL: 40 mg/dL (ref >39)
LDL Calculated: 147 mg/dL — ABNORMAL HIGH (ref 0–99)
TRIGLYCERIDES: 296 mg/dL — AB (ref 0–149)
VLDL CHOLESTEROL CAL: 59 mg/dL — AB (ref 5–40)

## 2016-08-04 LAB — HEMOGLOBIN A1C
ESTIMATED AVERAGE GLUCOSE: 114 mg/dL
Hgb A1c MFr Bld: 5.6 % (ref 4.8–5.6)

## 2016-08-04 LAB — MICROALBUMIN, URINE: Microalbumin, Urine: 9.9 ug/mL

## 2016-08-15 ENCOUNTER — Encounter: Payer: Self-pay | Admitting: Family Medicine

## 2016-10-27 ENCOUNTER — Telehealth: Payer: Self-pay | Admitting: Family Medicine

## 2016-10-27 NOTE — Telephone Encounter (Signed)
l/m with md response See refill cialis

## 2016-10-27 NOTE — Telephone Encounter (Signed)
Let him know ALL statin drugs are affected by grapefruit because it changes the liver enzymes.   He should be careful of diets requiring sugary fruits such as grapefruit because long term that sugar causes weight gain

## 2016-10-27 NOTE — Telephone Encounter (Signed)
Pt is starting a diet that requires him to eat grapefruit and would like to have his lipitor changed because instructions states do not eat grapefruit while taking this meds and also would like a refill of cialis   Best number [phone]

## 2016-11-01 MED ORDER — TADALAFIL 20 MG PO TABS: ORAL_TABLET | ORAL | 0 refills | Status: AC

## 2016-11-01 NOTE — Telephone Encounter (Signed)
Medication refilled

## 2016-12-01 ENCOUNTER — Ambulatory Visit (HOSPITAL_BASED_OUTPATIENT_CLINIC_OR_DEPARTMENT_OTHER)
Admission: RE | Admit: 2016-12-01 | Discharge: 2016-12-01 | Disposition: A | Discharge status: Home / Self Care | Payer: BLUE CROSS/BLUE SHIELD | Source: Ambulatory Visit | Attending: Family Medicine | Admitting: Family Medicine

## 2016-12-01 ENCOUNTER — Ambulatory Visit (INDEPENDENT_AMBULATORY_CARE_PROVIDER_SITE_OTHER): Payer: BLUE CROSS/BLUE SHIELD | Admitting: Family Medicine

## 2016-12-01 ENCOUNTER — Encounter: Payer: Self-pay | Admitting: Family Medicine

## 2016-12-01 ENCOUNTER — Ambulatory Visit (INDEPENDENT_AMBULATORY_CARE_PROVIDER_SITE_OTHER): Payer: BLUE CROSS/BLUE SHIELD

## 2016-12-01 VITALS — BP 160/96 | HR 64 | Temp 98.9°F | Resp 16 | Ht 71.0 in | Wt 221.0 lb

## 2016-12-01 DIAGNOSIS — K5792 Diverticulitis of intestine, part unspecified, without perforation or abscess without bleeding: Secondary | ICD-10-CM | POA: Diagnosis not present

## 2016-12-01 DIAGNOSIS — R1032 Left lower quadrant pain: Secondary | ICD-10-CM | POA: Insufficient documentation

## 2016-12-01 DIAGNOSIS — D72825 Bandemia: Secondary | ICD-10-CM | POA: Insufficient documentation

## 2016-12-01 LAB — COMPREHENSIVE METABOLIC PANEL
A/G RATIO: 2.1 (ref 1.2–2.2)
ALT: 19 IU/L (ref 0–44)
AST: 21 IU/L (ref 0–40)
Albumin: 4.6 g/dL (ref 3.6–4.8)
Alkaline Phosphatase: 69 IU/L (ref 39–117)
BUN/Creatinine Ratio: 10 (ref 10–24)
BUN: 9 mg/dL (ref 8–27)
Bilirubin Total: 0.8 mg/dL (ref 0.0–1.2)
CALCIUM: 9.3 mg/dL (ref 8.6–10.2)
CHLORIDE: 101 mmol/L (ref 96–106)
CO2: 26 mmol/L (ref 18–29)
Creatinine, Ser: 0.86 mg/dL (ref 0.76–1.27)
GFR, EST AFRICAN AMERICAN: 104 mL/min/{1.73_m2} (ref >59)
GFR, EST NON AFRICAN AMERICAN: 90 mL/min/{1.73_m2} (ref >59)
GLOBULIN, TOTAL: 2.2 g/dL (ref 1.5–4.5)
Glucose: 88 mg/dL (ref 65–99)
POTASSIUM: 4.5 mmol/L (ref 3.5–5.2)
Sodium: 140 mmol/L (ref 134–144)
TOTAL PROTEIN: 6.8 g/dL (ref 6.0–8.5)

## 2016-12-01 LAB — POCT URINALYSIS DIP (MANUAL ENTRY)
Bilirubin, UA: NEGATIVE
GLUCOSE UA: NEGATIVE mg/dL
Ketones, POC UA: NEGATIVE mg/dL
Leukocytes, UA: NEGATIVE
NITRITE UA: NEGATIVE
PROTEIN UA: NEGATIVE mg/dL
RBC UA: NEGATIVE
SPEC GRAV UA: 1.025 (ref 1.010–1.025)
UROBILINOGEN UA: 0.2 U/dL
pH, UA: 5.5 (ref 5.0–8.0)

## 2016-12-01 LAB — POC MICROSCOPIC URINALYSIS (UMFC): Mucus: ABSENT

## 2016-12-01 LAB — POCT CBC
GRANULOCYTE PERCENT: 74.6 % (ref 37–80)
HCT, POC: 48.5 % (ref 43.5–53.7)
HEMOGLOBIN: 16.6 g/dL (ref 14.1–18.1)
Lymph, poc: 2.6 (ref 0.6–3.4)
MCH: 31.6 pg — AB (ref 27–31.2)
MCHC: 34.2 g/dL (ref 31.8–35.4)
MCV: 92.5 fL (ref 80–97)
MID (cbc): 1 — AB (ref 0–0.9)
MPV: 7.3 fL (ref 0–99.8)
PLATELET COUNT, POC: 189 10*3/uL (ref 142–424)
POC Granulocyte: 10.5 — AB (ref 2–6.9)
POC LYMPH PERCENT: 18.5 %L (ref 10–50)
POC MID %: 6.9 %M (ref 0–12)
RBC: 5.24 M/uL (ref 4.69–6.13)
RDW, POC: 13.5 %
WBC: 14.1 10*3/uL — AB (ref 4.6–10.2)

## 2016-12-01 LAB — IFOBT (OCCULT BLOOD): IMMUNOLOGICAL FECAL OCCULT BLOOD TEST: NEGATIVE

## 2016-12-01 MED ORDER — METRONIDAZOLE 500 MG PO TABS: 500.0000 mg | ORAL_TABLET | Freq: Three times a day (TID) | ORAL | 0 refills | Status: AC

## 2016-12-01 MED ORDER — CIPROFLOXACIN HCL 500 MG PO TABS: 500.0000 mg | ORAL_TABLET | Freq: Two times a day (BID) | ORAL | 0 refills | Status: AC

## 2016-12-01 MED ORDER — IOPAMIDOL (ISOVUE-300) INJECTION 61%
100.0000 mL | Freq: Once | INTRAVENOUS | Status: AC | PRN
Start: 2016-12-01 — End: 2016-12-01
  Administered 2016-12-01: 100 mL via INTRAVENOUS

## 2016-12-01 NOTE — Progress Notes (Signed)
Chief Complaint  Patient presents with  . Abdominal Pain    Pt concerned of kidney stone  . Flank Pain    HPI   Pt reports that he has been having LLQ abdominal pain  He states that he had a bowel movement yesterday He states that the pain is constant with some pain in the umbilicus Last night he could not sleep because of the pain  The pain is 5/10, feels like a constant pressure He reports that since moving around the pain has lightened  The patient reports that onset was early yesterday morning He states that after his bowel movement yesterday he did not see a change to his pain Last night he had a bowl of chicken noodle soup and that was it There was small improvement to the abdominal pain after the chicken noodle soup.  Colonoscopy Report in 2013- Transverse colon polyp, sigmoid diverticulosis, external hemorrhoids   Past Medical History:  Diagnosis Date  . Abnormal EKG 02/19/2012  . Allergy   . Blood transfusion without reported diagnosis   . Cancer ([place])   . Hypertension     Current Outpatient Prescriptions  Medication Sig Dispense Refill  . aspirin 81 MG tablet Take 81 mg by mouth daily.    Marland Kitchen atorvastatin (LIPITOR) 40 MG tablet Take 1 tablet (40 mg total) by mouth at bedtime. 90 tablet 1  . Azelastine HCl 0.15 % SOLN Place 2 sprays into the nose 2 (two) times daily. 30 mL 0  . cetirizine (ZYRTEC) 10 MG tablet Take 1 tablet (10 mg total) by mouth daily. 30 tablet 5  . ciclopirox (PENLAC) 8 % solution Apply topically at bedtime. Apply over nail and surrounding skin. Apply daily over previous coat. After seven (7) days, may remove with alcohol and continue cycle. 6.6 mL 2  . fluticasone (FLONASE) 50 MCG/ACT nasal spray Place 2 sprays into both nostrils daily. 16 g 11  . lisinopril (PRINIVIL,ZESTRIL) 40 MG tablet Take 1 tablet (40 mg total) by mouth daily. 90 tablet 1  . Multiple Vitamin (MULTIVITAMIN WITH MINERALS) TABS Take 1 tablet by mouth daily.    . rosuvastatin  (CRESTOR) 10 MG tablet Take 1 tablet (10 mg total) by mouth daily. 90 tablet 3  . tadalafil (CIALIS) 20 MG tablet TAKE 1 TABLET BY MOUTH AS NEEDED AS DIRECTED 30 tablet 0  . ciprofloxacin (CIPRO) 500 MG tablet Take 1 tablet (500 mg total) by mouth 2 (two) times daily. 20 tablet 0  . metroNIDAZOLE (FLAGYL) 500 MG tablet Take 1 tablet (500 mg total) by mouth 3 (three) times daily. 30 tablet 0   No current facility-administered medications for this visit.     Allergies: No Known Allergies  Past Surgical History:  Procedure Laterality Date  . [place]  . cervical kidney stones    . LUMBAR LAMINECTOMY    . LYMPHADENECTOMY  2009   pelvic lymphadenectomy by Dr. Irine Seal  . PROSTATECTOMY      Social History   Social History  . Marital status: Married    Spouse name: [name]  . Number of children: 6  . Years of education: Associates   Occupational History  . [employer] [place]   Social History Main Topics  . Smoking status: Never Smoker  . Smokeless tobacco: Never Used  . Alcohol use 0.0 - 0.5 oz/week  . Drug use: No  . Sexual activity: Yes  Birth control/ protection: None   Other Topics Concern  . None   Social History Narrative   Lives with his wife and their daughter.  They each have children from previous relationships.    ROS  Objective: Vitals:   12/01/16 0843  BP: (!) 160/96  Pulse: 64  Resp: 16  Temp: 98.9 F (37.2 C)  TempSrc: Oral  SpO2: 99%  Weight: 221 lb (100.2 kg)  Height: 5\' 11"  (1.803 m)    Physical Exam  Constitutional: He is oriented to person, place, and time. He appears well-developed and well-nourished.  HENT:  Head: Normocephalic and atraumatic.  Cardiovascular: Normal rate, regular rhythm and normal heart sounds.   No murmur heard. Pulmonary/Chest: Effort normal and breath sounds normal. No respiratory distress. He has no wheezes.  Abdominal:  Soft. Bowel sounds are normal. He exhibits no distension. There is no hepatosplenomegaly, splenomegaly or hepatomegaly. There is tenderness in the left lower quadrant. There is no rigidity, no rebound, no guarding, no tenderness at McBurney's point and negative Murphy's sign.  Neurological: He is alert and oriented to person, place, and time.    Rectal Exam Chaperone: Caren Griffins - normal sphincter tone No palpable mass No gross blood Prostate size normal  IMPRESSION: Focal diverticulitis is noted at the junction of the descending and sigmoid colon. No abscess formation is noted. These results will be called to the ordering clinician or representative by the Radiologist Assistant, and communication documented in the PACS or zVision Dashboard.   Electronically Signed   By: Marijo Conception, M.D.   On: 12/01/2016 16:41  Assessment and Plan Damel was seen today for abdominal pain and flank pain.  Diagnoses and all orders for this visit:  Abdominal pain, left lower quadrant- pt with acute onset of diverticulitis UA negative for infection CBC showed leukocytosis KUB negative  -     POCT urinalysis dipstick -     POCT Microscopic Urinalysis ([facility]) -     POCT CBC -     IFOBT POC (occult bld, rslt in office); Future -     DG Abd 1 View -     Cancel: Comprehensive metabolic panel -     CT Abdomen Pelvis W Contrast; Future -     IFOBT POC (occult bld, rslt in office) -     Basic metabolic panel; Future -     Comprehensive metabolic panel  Bandemia-  Will repeat labs in one week bandemia due to diverticulitis, report did not show abscess -     CT Abdomen Pelvis W Contrast; Future  Acute diverticulitis- discussed result with patient -     Basic metabolic panel; Future -     metroNIDAZOLE (FLAGYL) 500 MG tablet; Take 1 tablet (500 mg total) by mouth 3 (three) times daily. -     ciprofloxacin (CIPRO) 500 MG tablet; Take 1 tablet (500 mg total) by mouth 2 (two) times daily.   A  total of 30 minutes were spent face-to-face with the patient during this encounter and over half of that time was spent on counseling and coordination of care.    Prot[place]in

## 2016-12-01 NOTE — Patient Instructions (Addendum)
GO TO MED CENTER [facility] [place]  [phone] NOTHING TO EAT OR DRINK YOU WILL BE THERE FOR 3 HOURS (DRINKING CONTRAST AND TEST)    IF you received an x-ray today, you will receive an invoice from [facility]. Please contact [facility] at [phone] with questions or concerns regarding your invoice.   IF you received labwork today, you will receive an invoice from [facility]. Please contact [facility] at [phone] with questions or concerns regarding your invoice.   Our billing staff will not be able to assist you with questions regarding bills from these companies.  You will be contacted with the lab results as soon as they are available. The fastest way to get your results is to activate your My Chart account. Instructions are located on the last page of this paperwork. If you have not heard from [place] regarding the results in 2 weeks, please contact this office.     Diverticulitis Diverticulitis is inflammation or infection of small pouches in your colon that form when you have a condition called diverticulosis. The pouches in your colon are called diverticula. Your colon, or large intestine, is where water is absorbed and stool is formed. Complications of diverticulitis can include:  Bleeding.  Severe infection.  Severe pain.  Perforation of your colon.  Obstruction of your colon. What are the causes? Diverticulitis is caused by bacteria. Diverticulitis happens when stool becomes trapped in diverticula. This allows bacteria to grow in the diverticula, which can lead to inflammation and infection. What increases the risk? People with diverticulosis are at risk for diverticulitis. Eating a diet that does not include enough fiber from fruits and vegetables may make diverticulitis more likely to develop. What are the signs or symptoms? Symptoms of diverticulitis may include:  Abdominal pain and tenderness. The pain is normally located on  the left side of the abdomen, but may occur in other areas.  Fever and chills.  Bloating.  Cramping.  Nausea.  Vomiting.  Constipation.  Diarrhea.  Blood in your stool. How is this diagnosed? Your health care provider will ask you about your medical history and do a physical exam. You may need to have tests done because many medical conditions can cause the same symptoms as diverticulitis. Tests may include:  Blood tests.  Urine tests.  Imaging tests of the abdomen, including X-rays and CT scans. When your condition is under control, your health care provider may recommend that you have a colonoscopy. A colonoscopy can show how severe your diverticula are and whether something else is causing your symptoms. How is this treated? Most cases of diverticulitis are mild and can be treated at home. Treatment may include:  Taking over-the-counter pain medicines.  Following a clear liquid diet.  Taking antibiotic medicines by mouth for 7-10 days. More severe cases may be treated at a hospital. Treatment may include:  Not eating or drinking.  Taking prescription pain medicine.  Receiving antibiotic medicines through an IV tube.  Receiving fluids and nutrition through an IV tube.  Surgery. Follow these instructions at home:  Follow your health care provider's instructions carefully.  Follow a full liquid diet or other diet as directed by your health care provider. After your symptoms improve, your health care provider may tell you to change your diet. He or she may recommend you eat a high-fiber diet. Fruits and vegetables are good sources of fiber. Fiber makes it easier to pass stool.  Take fiber supplements or probiotics as directed by  your health care provider.  Only take medicines as directed by your health care provider.  Keep all your follow-up appointments. Contact a health care provider if:  Your pain does not improve.  You have a hard time eating  food.  Your bowel movements do not return to normal. Get help right away if:  Your pain becomes worse.  Your symptoms do not get better.  Your symptoms suddenly get worse.  You have a fever.  You have repeated vomiting.  You have bloody or black, tarry stools. This information is not intended to replace advice given to you by your health care provider. Make sure you discuss any questions you have with your health care provider. Document Released: 05/03/2005 Document Revised: 12/30/2015 Document Reviewed: 06/18/2013 Elsevier Interactive Patient Education  2017 [employer].

## 2016-12-08 ENCOUNTER — Ambulatory Visit (INDEPENDENT_AMBULATORY_CARE_PROVIDER_SITE_OTHER): Payer: BLUE CROSS/BLUE SHIELD | Admitting: Family Medicine

## 2016-12-08 ENCOUNTER — Encounter: Payer: Self-pay | Admitting: Family Medicine

## 2016-12-08 VITALS — BP 128/78 | HR 55 | Temp 98.3°F | Resp 16 | Ht 71.0 in | Wt 223.0 lb

## 2016-12-08 DIAGNOSIS — K5792 Diverticulitis of intestine, part unspecified, without perforation or abscess without bleeding: Secondary | ICD-10-CM

## 2016-12-08 LAB — POCT CBC
Granulocyte percent: 52 %G (ref 37–80)
HEMATOCRIT: 44 % (ref 43.5–53.7)
Hemoglobin: 15.1 g/dL (ref 14.1–18.1)
LYMPH, POC: 2.3 (ref 0.6–3.4)
MCH, POC: 31.6 pg — AB (ref 27–31.2)
MCHC: 34.3 g/dL (ref 31.8–35.4)
MCV: 92.2 fL (ref 80–97)
MID (cbc): 0.5 (ref 0–0.9)
MPV: 7.3 fL (ref 0–99.8)
PLATELET COUNT, POC: 233 10*3/uL (ref 142–424)
POC Granulocyte: 3.1 (ref 2–6.9)
POC LYMPH %: 39.6 % (ref 10–50)
POC MID %: 8.4 %M (ref 0–12)
RBC: 4.77 M/uL (ref 4.69–6.13)
RDW, POC: 12.8 %
WBC: 5.9 10*3/uL (ref 4.6–10.2)

## 2016-12-08 NOTE — Patient Instructions (Addendum)
Align probiotic over the counter will help with restoring the good gut bacteria.   Your white blood cell count is improved!  Component     Latest Ref Rng & Units 12/01/2016 12/08/2016  WBC     4.6 - 10.2 K/uL 14.1 (A) 5.9  Lymph, poc     0.6 - 3.4 2.6 2.3  POC LYMPH PERCENT     10 - 50 %L 18.5 39.6  MID (cbc)     0 - 0.9 1.0 (A) 0.5  POC MID %     0 - 12 %M 6.9 8.4  POC Granulocyte     2 - 6.9 10.5 (A) 3.1  Granulocyte percent     37 - 80 %G 74.6 52.0  RBC     4.69 - 6.13 M/uL 5.24 4.77  Hemoglobin     14.1 - 18.1 g/dL 16.6 15.1  HCT     43.5 - 53.7 % 48.5 44.0  MCV     80 - 97 fL 92.5 92.2  MCH     27 - 31.2 pg 31.6 (A) 31.6 (A)  MCHC     31.8 - 35.4 g/dL 34.2 34.3  RDW, POC     % 13.5 12.8  Platelet Count, POC     142 - 424 K/uL 189 233  MPV     0 - 99.8 fL 7.3 7.3   IF you received an x-ray today, you will receive an invoice from [facility]. Please contact [facility] at [phone] with questions or concerns regarding your invoice.   IF you received labwork today, you will receive an invoice from [place]. Please contact [facility] at [phone] with questions or concerns regarding your invoice.   Our billing staff will not be able to assist you with questions regarding bills from these companies.  You will be contacted with the lab results as soon as they are available. The fastest way to get your results is to activate your My Chart account. Instructions are located on the last page of this paperwork. If you have not heard from [place] regarding the results in 2 weeks, please contact this office.     Diverticulitis Diverticulitis is inflammation or infection of small pouches in your colon that form when you have a condition called diverticulosis. The pouches in your colon are called diverticula. Your colon, or large intestine, is where water is absorbed and stool is formed. Complications of diverticulitis can include:  Bleeding.  Severe  infection.  Severe pain.  Perforation of your colon.  Obstruction of your colon. What are the causes? Diverticulitis is caused by bacteria. Diverticulitis happens when stool becomes trapped in diverticula. This allows bacteria to grow in the diverticula, which can lead to inflammation and infection. What increases the risk? People with diverticulosis are at risk for diverticulitis. Eating a diet that does not include enough fiber from fruits and vegetables may make diverticulitis more likely to develop. What are the signs or symptoms? Symptoms of diverticulitis may include:  Abdominal pain and tenderness. The pain is normally located on the left side of the abdomen, but may occur in other areas.  Fever and chills.  Bloating.  Cramping.  Nausea.  Vomiting.  Constipation.  Diarrhea.  Blood in your stool. How is this diagnosed? Your health care provider will ask you about your medical history and do a physical exam. You may need to have tests done because many medical conditions can cause the same symptoms as diverticulitis. Tests may include:  Blood  tests.  Urine tests.  Imaging tests of the abdomen, including X-rays and CT scans. When your condition is under control, your health care provider may recommend that you have a colonoscopy. A colonoscopy can show how severe your diverticula are and whether something else is causing your symptoms. How is this treated? Most cases of diverticulitis are mild and can be treated at home. Treatment may include:  Taking over-the-counter pain medicines.  Following a clear liquid diet.  Taking antibiotic medicines by mouth for 7-10 days. More severe cases may be treated at a hospital. Treatment may include:  Not eating or drinking.  Taking prescription pain medicine.  Receiving antibiotic medicines through an IV tube.  Receiving fluids and nutrition through an IV tube.  Surgery. Follow these instructions at home:  Follow  your health care provider's instructions carefully.  Follow a full liquid diet or other diet as directed by your health care provider. After your symptoms improve, your health care provider may tell you to change your diet. He or she may recommend you eat a high-fiber diet. Fruits and vegetables are good sources of fiber. Fiber makes it easier to pass stool.  Take fiber supplements or probiotics as directed by your health care provider.  Only take medicines as directed by your health care provider.  Keep all your follow-up appointments. Contact a health care provider if:  Your pain does not improve.  You have a hard time eating food.  Your bowel movements do not return to normal. Get help right away if:  Your pain becomes worse.  Your symptoms do not get better.  Your symptoms suddenly get worse.  You have a fever.  You have repeated vomiting.  You have bloody or black, tarry stools. This information is not intended to replace advice given to you by your health care provider. Make sure you discuss any questions you have with your health care provider. Document Released: 05/03/2005 Document Revised: 12/30/2015 Document Reviewed: 06/18/2013 Elsevier Interactive Patient Education  2017 [employer].

## 2016-12-08 NOTE — Progress Notes (Signed)
Chief Complaint  Patient presents with  . Follow-up    Diverticulitis    HPI  Diverticulitis: Patient complains of diffuse abdominal pain and LLQ abdominal pain.  He reports that his pain is >50% better and his stool is now formed with a brown color and no longer black.  He states that he has an upset feeling and unsettled feeling in his stomach.  He reports that he has been eating salad, chicken, soup and and drinking a lot of lipton diet green tea.   Symptoms have been gradually improving since. Pain is 2/10 today. No nausea, vomiting, fevers or chills.   Lab Results  Component Value Date   WBC 5.9 12/08/2016   HGB 15.1 12/08/2016   HCT 44.0 12/08/2016   MCV 92.2 12/08/2016   PLT 193 07/06/2015     Past Medical History:  Diagnosis Date  . Abnormal EKG 02/19/2012  . Allergy   . Blood transfusion without reported diagnosis   . Cancer ([place])   . Hypertension     Current Outpatient Prescriptions  Medication Sig Dispense Refill  . aspirin 81 MG tablet Take 81 mg by mouth daily.    Marland Kitchen atorvastatin (LIPITOR) 40 MG tablet Take 1 tablet (40 mg total) by mouth at bedtime. 90 tablet 1  . Azelastine HCl 0.15 % SOLN Place 2 sprays into the nose 2 (two) times daily. 30 mL 0  . cetirizine (ZYRTEC) 10 MG tablet Take 1 tablet (10 mg total) by mouth daily. 30 tablet 5  . ciclopirox (PENLAC) 8 % solution Apply topically at bedtime. Apply over nail and surrounding skin. Apply daily over previous coat. After seven (7) days, may remove with alcohol and continue cycle. 6.6 mL 2  . ciprofloxacin (CIPRO) 500 MG tablet Take 1 tablet (500 mg total) by mouth 2 (two) times daily. 20 tablet 0  . fluticasone (FLONASE) 50 MCG/ACT nasal spray Place 2 sprays into both nostrils daily. 16 g 11  . lisinopril (PRINIVIL,ZESTRIL) 40 MG tablet Take 1 tablet (40 mg total) by mouth daily. 90 tablet 1  . metroNIDAZOLE (FLAGYL) 500 MG tablet Take 1 tablet (500 mg total) by mouth 3 (three) times daily. 30 tablet 0  .  Multiple Vitamin (MULTIVITAMIN WITH MINERALS) TABS Take 1 tablet by mouth daily.    . rosuvastatin (CRESTOR) 10 MG tablet Take 1 tablet (10 mg total) by mouth daily. 90 tablet 3  . tadalafil (CIALIS) 20 MG tablet TAKE 1 TABLET BY MOUTH AS NEEDED AS DIRECTED 30 tablet 0   No current facility-administered medications for this visit.     Allergies: No Known Allergies  Past Surgical History:  Procedure Laterality Date  . [place]  . cervical kidney stones    . LUMBAR LAMINECTOMY    . LYMPHADENECTOMY  2009   pelvic lymphadenectomy by Dr. Irine Seal  . PROSTATECTOMY      Social History   Social History  . Marital status: Married    Spouse name: [name]  . Number of children: 6  . Years of education: Associates   Occupational History  . 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 Smoking status: Never Smoker  . Smokeless tobacco: Never Used  . Alcohol use 0.0 - 0.5 oz/week  . Drug use: No  . Sexual activity: Yes    Birth control/ protection: None   Other Topics Concern  . None  Social History Narrative   Lives with his wife and their daughter.  They each have children from previous relationships.    ROS See hpi  Objective: Vitals:   12/08/16 0839 12/08/16 0906  BP: (!) 147/89 128/78  Pulse: (!) 55   Resp: 16   Temp: 98.3 F (36.8 C)   TempSrc: Oral   SpO2: 96%   Weight: 223 lb (101.2 kg)   Height: 5\' 11"  (1.803 m)     Physical Exam  Constitutional: He appears well-developed and well-nourished.  HENT:  Head: Normocephalic and atraumatic.  Pulmonary/Chest: Effort normal.  Abdominal: Soft. Bowel sounds are normal. He exhibits no distension. There is tenderness.  LLQ tenderness in the same area as a week ago  Without rebound, guarding or palpable mass  Psychiatric: He has a normal mood and affect. His behavior is normal. Judgment and thought content normal.      Assessment and Plan Montee was seen today for follow-up.  Diagnoses and all orders for this visit:  Acute diverticulitis- pt completing his Cipro/Flagyl Making improvements in symptoms Discussed keeping diet bland and increasing hydration until pain has resolved Talked about need for increased fiber to prevent recurrence  Discussed need for 38g of fiber daily Gave handout Suggested Align probiotic  -     POCT [place]

## 2017-03-07 ENCOUNTER — Encounter: Payer: Self-pay | Admitting: Urgent Care

## 2017-03-07 ENCOUNTER — Ambulatory Visit (INDEPENDENT_AMBULATORY_CARE_PROVIDER_SITE_OTHER): Payer: BLUE CROSS/BLUE SHIELD | Admitting: Urgent Care

## 2017-03-07 VITALS — BP 150/88 | HR 67 | Temp 98.5°F | Resp 17 | Ht 69.5 in | Wt 221.0 lb

## 2017-03-07 DIAGNOSIS — R21 Rash and other nonspecific skin eruption: Secondary | ICD-10-CM

## 2017-03-07 DIAGNOSIS — R030 Elevated blood-pressure reading, without diagnosis of hypertension: Secondary | ICD-10-CM

## 2017-03-07 DIAGNOSIS — L239 Allergic contact dermatitis, unspecified cause: Secondary | ICD-10-CM | POA: Diagnosis not present

## 2017-03-07 MED ORDER — TRIAMCINOLONE ACETONIDE 0.1 % EX CREA: 1.0000 "application " | TOPICAL_CREAM | Freq: Two times a day (BID) | CUTANEOUS | 0 refills | Status: AC

## 2017-03-07 MED ORDER — CEPHALEXIN 500 MG PO CAPS: 500.0000 mg | ORAL_CAPSULE | Freq: Three times a day (TID) | ORAL | 0 refills | Status: AC

## 2017-03-07 MED ORDER — RANITIDINE HCL 150 MG PO TABS
150.0000 mg | ORAL_TABLET | Freq: Two times a day (BID) | ORAL | 0 refills | Status: AC
Start: 2017-03-07 — End: ?

## 2017-03-07 NOTE — Progress Notes (Addendum)
  MRN: [id number] DOB: [birth year]  Subjective:   Nicholas Wallace is a 66 y.o. male presenting for chief complaint of Insect Bite  Reports a 3 day history of itchy, stinging rash over his legs. Patient was working in the yard this past Sunday, was working with brush mostly. Cannot recall any tick bites, insect bites. Since then the rash has manifested as red itchy spots over his lower legs and spread to his thighs with some coming to a head. Denies fever, headache, red eyes, malaise, cough, n/v, abdominal pain sore throat, rash over palms and soles of feet. He has been using calamine lotion and rubbing alcohol with mild relief.   Nicholas Wallace has a current medication list which includes the following prescription(s): aspirin, atorvastatin, azelastine hcl, cetirizine, ciclopirox, fluticasone, lisinopril, multivitamin with minerals, rosuvastatin, and tadalafil. Also has No Known Allergies.  Nicholas Wallace  has a past medical history of Abnormal EKG (02/19/2012); Allergy; Blood transfusion without reported diagnosis; Cancer ([place]); and Hypertension. Also  has a past surgical history that includes Back surgery (1985); Prostatectomy; Lymphadenectomy (2009); Lumbar laminectomy; and cervical kidney stones.  Objective:   Vitals: BP (!) 150/88   Pulse 67   Temp 98.5 F (36.9 C) (Oral)   Resp 17   Ht 5' 9.5" (1.765 m)   Wt 221 lb (100.2 kg)   SpO2 98%   BMI 32.17 kg/m   BP Readings from Last 3 Encounters:  03/07/17 (!) 150/88  12/08/16 128/78  12/01/16 (!) 160/96    Physical Exam  Constitutional: He is oriented to person, place, and time. He appears well-developed and well-nourished.  HENT:  Mouth/Throat: Oropharynx is clear and moist.  Eyes: No scleral icterus.  Cardiovascular: Normal rate.   Pulmonary/Chest: Effort normal.  Neurological: He is alert and oriented to person, place, and time.  Skin: Skin is warm and dry. Rash (multiple erythematous lesions of varying sizes ranging from 0.5cm-1cm over his  lower legs and thighs) noted.  Psychiatric: He has a normal mood and affect.    Assessment and Plan :   1. Allergic dermatitis 2. Rash and nonspecific skin eruption - Will manage as allergic dermatitis with Kenalog, Zyrtec and Zantac. Will cover for secondary infection with Keflex for 5 days. Stop using alcohol as this is likely drying out his skin and making his symptoms worse. Return-to-clinic precautions discussed, patient verbalized understanding. Otherwise, f/u in 1 week.  3. Elevated blood pressure reading - Patient has known history of HTN, will monitor BP at home.  Jaynee Eagles, PA-C [facility] [phone] 03/07/2017  5:14 PM

## 2017-03-07 NOTE — Patient Instructions (Addendum)
Apply the steroid cream to the most itchy spots of your rash unless the look infected (have a head). Apply warm compresses to those parts of the rash. You can use the steroid cream for 1 week. Take Zyrtec and Zantac to help with your itching and allergic dermatitis. The antibiotic, Keflex, will take care of parts of the rash that are becoming infected.   Contact Dermatitis Dermatitis is redness, soreness, and swelling (inflammation) of the skin. Contact dermatitis is a reaction to certain substances that touch the skin. There are two types of contact dermatitis:  Irritant contact dermatitis. This type is caused by something that irritates your skin, such as dry hands from washing them too much. This type does not require previous exposure to the substance for a reaction to occur. This type is more common.  Allergic contact dermatitis. This type is caused by a substance that you are allergic to, such as a nickel allergy or poison ivy. This type only occurs if you have been exposed to the substance (allergen) before. Upon a repeat exposure, your body reacts to the substance. This type is less common.  What are the causes? Many different substances can cause contact dermatitis. Irritant contact dermatitis is most commonly caused by exposure to:  Makeup.  Soaps.  Detergents.  Bleaches.  Acids.  Metal salts, such as nickel.  Allergic contact dermatitis is most commonly caused by exposure to:  Poisonous plants.  Chemicals.  Jewelry.  Latex.  Medicines.  Preservatives in products, such as clothing.  What increases the risk? This condition is more likely to develop in:  People who have jobs that expose them to irritants or allergens.  People who have certain medical conditions, such as asthma or eczema.  What are the signs or symptoms? Symptoms of this condition may occur anywhere on your body where the irritant has touched you or is touched by you. Symptoms include:  Dryness  or flaking.  Redness.  Cracks.  Itching.  Pain or a burning feeling.  Blisters.  Drainage of small amounts of blood or clear fluid from skin cracks.  With allergic contact dermatitis, there may also be swelling in areas such as the eyelids, mouth, or genitals. How is this diagnosed? This condition is diagnosed with a medical history and physical exam. A patch skin test may be performed to help determine the cause. If the condition is related to your job, you may need to see an occupational medicine specialist. How is this treated? Treatment for this condition includes figuring out what caused the reaction and protecting your skin from further contact. Treatment may also include:  Steroid creams or ointments. Oral steroid medicines may be needed in more severe cases.  Antibiotics or antibacterial ointments, if a skin infection is present.  Antihistamine lotion or an antihistamine taken by mouth to ease itching.  A bandage (dressing).  Follow these instructions at home: Adrian your skin as needed.  Apply cool compresses to the affected areas.  Try taking a bath with: ? Epsom salts. Follow the instructions on the packaging. You can get these at your local pharmacy or grocery store. ? Baking soda. Pour a small amount into the bath as directed by your health care provider. ? Colloidal oatmeal. Follow the instructions on the packaging. You can get this at your local pharmacy or grocery store.  Try applying baking soda paste to your skin. Stir water into baking soda until it reaches a paste-like consistency.  Do not scratch your  skin.  Bathe less frequently, such as every other day.  Bathe in lukewarm water. Avoid using hot water. Medicines  Take or apply over-the-counter and prescription medicines only as told by your health care provider.  If you were prescribed an antibiotic medicine, take or apply your antibiotic as told by your health care provider. Do  not stop using the antibiotic even if your condition starts to improve. General instructions  Keep all follow-up visits as told by your health care provider. This is important.  Avoid the substance that caused your reaction. If you do not know what caused it, keep a journal to try to track what caused it. Write down: ? What you eat. ? What cosmetic products you use. ? What you drink. ? What you wear in the affected area. This includes jewelry.  If you were given a dressing, take care of it as told by your health care provider. This includes when to change and remove it. Contact a health care provider if:  Your condition does not improve with treatment.  Your condition gets worse.  You have signs of infection such as swelling, tenderness, redness, soreness, or warmth in the affected area.  You have a fever.  You have new symptoms. Get help right away if:  You have a severe headache, neck pain, or neck stiffness.  You vomit.  You feel very sleepy.  You notice red streaks coming from the affected area.  Your bone or joint underneath the affected area becomes painful after the skin has healed.  The affected area turns darker.  You have difficulty breathing. This information is not intended to replace advice given to you by your health care provider. Make sure you discuss any questions you have with your health care provider. Document Released: 07/21/2000 Document Revised: 12/30/2015 Document Reviewed: 12/09/2014 Elsevier Interactive Patient Education  2018 [employer].     IF you received an x-ray today, you will receive an invoice from [facility]. Please contact [facility] at [phone] with questions or concerns regarding your invoice.   IF you received labwork today, you will receive an invoice from [facility]. Please contact [facility] at [phone] with questions or concerns regarding your invoice.   Our billing staff will not be able to assist  you with questions regarding bills from these companies.  You will be contacted with the lab results as soon as they are available. The fastest way to get your results is to activate your My Chart account. Instructions are located on the last page of this paperwork. If you have not heard from [place] regarding the results in 2 weeks, please contact this office.

## 2017-04-16 ENCOUNTER — Ambulatory Visit (INDEPENDENT_AMBULATORY_CARE_PROVIDER_SITE_OTHER): Payer: BLUE CROSS/BLUE SHIELD | Admitting: Family Medicine

## 2017-04-16 ENCOUNTER — Encounter: Payer: Self-pay | Admitting: Family Medicine

## 2017-04-16 VITALS — BP 129/87 | HR 69 | Temp 98.0°F | Resp 16 | Ht 68.5 in | Wt 213.4 lb

## 2017-04-16 DIAGNOSIS — R5383 Other fatigue: Secondary | ICD-10-CM | POA: Diagnosis not present

## 2017-04-16 DIAGNOSIS — I10 Essential (primary) hypertension: Secondary | ICD-10-CM

## 2017-04-16 DIAGNOSIS — Z23 Encounter for immunization: Secondary | ICD-10-CM | POA: Diagnosis not present

## 2017-04-16 MED ORDER — LISINOPRIL 20 MG PO TABS: 20.0000 mg | ORAL_TABLET | Freq: Every day | ORAL | 0 refills | Status: DC

## 2017-04-16 NOTE — Patient Instructions (Signed)
     IF you received an x-ray today, you will receive an invoice from [facility]. Please contact [facility] at [phone] with questions or concerns regarding your invoice.   IF you received labwork today, you will receive an invoice from [facility]. Please contact [facility] at [phone] with questions or concerns regarding your invoice.   Our billing staff will not be able to assist you with questions regarding bills from these companies.  You will be contacted with the lab results as soon as they are available. The fastest way to get your results is to activate your My Chart account. Instructions are located on the last page of this paperwork. If you have not heard from us regarding the results in 2 weeks, please contact this office.     

## 2017-04-16 NOTE — Progress Notes (Signed)
Chief Complaint  Patient presents with  . Arm Pain    left elbow pain, onset: x 2 weeks, pain level 7-8/10 intermittently, constantly painful, sensitive to touch, no otc meds for pain  . Fatigue    weak and achy feeling upon standing up and feeling weak and draggy feeling over the past week    HPI Fatigue:  Pt reports that he feels tired lately He states that getting up and getting movement especially after sitting for a while he gets up slowly He states that he feels a little "draggy" with a generalized achiness and weakness all over After doing yard work he has felt like he has to put in more effort after doing activities that he always does quickly in the past.  He feels more unstable physically  He denies recent falls At night he gets at least 8 hours He denies depressed mood Depression screen [facility] 2/9 04/16/2017 03/07/2017 12/08/2016 12/01/2016 08/03/2016  Decreased Interest 0 0 0 0 0  Down, Depressed, Hopeless 0 0 0 0 0  PHQ - 2 Score 0 0 0 0 0     Hypertension He reports that he takes his medications as prescribed He is taking lisinopril 40mg  He states that he has not cough or dizziness He has not been exercising outside of yard work BP Readings from Last 3 Encounters:  04/16/17 129/87  03/07/17 (!) 150/88  12/08/16 128/78    Wt Readings from Last 3 Encounters:  04/16/17 213 lb 6.4 oz (96.8 kg)  03/07/17 221 lb (100.2 kg)  12/08/16 223 lb (101.2 kg)   He is working hard to achieve weight loss He states that he exercises but does not feel more energized  Body mass index is 31.98 kg/m.  Past Medical History:  Diagnosis Date  . Abnormal EKG 02/19/2012  . Allergy   . Blood transfusion without reported diagnosis   . Cancer ([place])   . Hypertension     Current Outpatient Prescriptions  Medication Sig Dispense Refill  . aspirin 81 MG tablet Take 81 mg by mouth daily.    Marland Kitchen atorvastatin (LIPITOR) 40 MG tablet Take 1 tablet (40 mg total) by mouth at bedtime. 90 tablet  1  . Azelastine HCl 0.15 % SOLN Place 2 sprays into the nose 2 (two) times daily. 30 mL 0  . cetirizine (ZYRTEC) 10 MG tablet Take 1 tablet (10 mg total) by mouth daily. 30 tablet 5  . ciclopirox (PENLAC) 8 % solution Apply topically at bedtime. Apply over nail and surrounding skin. Apply daily over previous coat. After seven (7) days, may remove with alcohol and continue cycle. 6.6 mL 2  . lisinopril (PRINIVIL,ZESTRIL) 20 MG tablet Take 1 tablet (20 mg total) by mouth daily. 30 tablet 0  . Multiple Vitamin (MULTIVITAMIN WITH MINERALS) TABS Take 1 tablet by mouth daily.    . tadalafil (CIALIS) 20 MG tablet TAKE 1 TABLET BY MOUTH AS NEEDED AS DIRECTED 30 tablet 0  . triamcinolone cream (KENALOG) 0.1 % Apply 1 application topically 2 (two) times daily. 30 g 0  . cephALEXin (KEFLEX) 500 MG capsule Take 1 capsule (500 mg total) by mouth 3 (three) times daily with meals. (Patient not taking: Reported on 04/16/2017) 15 capsule 0  . fluticasone (FLONASE) 50 MCG/ACT nasal spray Place 2 sprays into both nostrils daily. (Patient not taking: Reported on 04/16/2017) 16 g 11  . ranitidine (ZANTAC) 150 MG tablet Take 1 tablet (150 mg total) by mouth 2 (two) times daily. (Patient not  taking: Reported on 04/16/2017) 60 tablet 0  . rosuvastatin (CRESTOR) 10 MG tablet Take 1 tablet (10 mg total) by mouth daily. (Patient not taking: Reported on 04/16/2017) 90 tablet 3   No current facility-administered medications for this visit.     Allergies: No Known Allergies  Past Surgical History:  Procedure Laterality Date  . [place]  . cervical kidney stones    . LUMBAR LAMINECTOMY    . LYMPHADENECTOMY  2009   pelvic lymphadenectomy by Dr. Irine Seal  . PROSTATECTOMY      Social History   Social History  . Marital status: Married    Spouse name: [name]  . Number of children: 6  . Years of education: Associates   Occupational History  . 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 Smoking status: Never Smoker  . Smokeless tobacco: Never Used  . Alcohol use 0.0 - 0.5 oz/week  . Drug use: No  . Sexual activity: Yes    Birth control/ protection: None   Other Topics Concern  . None   Social History Narrative   Lives with his wife and their daughter.  They each have children from previous relationships.    ROS Review of Systems See HPI Constitution: No fevers or chills No malaise No diaphoresis Skin: No rash or itching Eyes: no blurry vision, no double vision GU: no dysuria or hematuria Neuro: no dizziness or headaches  Objective: Vitals:   04/16/17 1437  BP: 129/87  Pulse: 69  Resp: 16  Temp: 98 F (36.7 C)  TempSrc: Oral  SpO2: 97%  Weight: 213 lb 6.4 oz (96.8 kg)  Height: 5' 8.5" (1.74 m)    Physical Exam  Constitutional: He is oriented to person, place, and time. He appears well-developed and well-nourished.  HENT:  Head: Normocephalic and atraumatic.  Nose: Nose normal.  Eyes: Conjunctivae and EOM are normal.  Cardiovascular: Normal rate, regular rhythm and normal heart sounds.   Pulmonary/Chest: Effort normal and breath sounds normal. No respiratory distress. He has no wheezes.  Neurological: He is alert and oriented to person, place, and time.  Psychiatric: He has a normal mood and affect. His behavior is normal. Judgment and thought content normal.    Assessment and Plan Kable was seen today for arm pain and fatigue.  Diagnoses and all orders for this visit:  Essential hypertension, benign- bp well controlled, refilled meds, halved lisinopril from 40mg  to 20mg  to see if bp overcorrection is causing the fatigue  Fatigue, unspecified type- normal thyroid function in the past, will recheck Will also check for lyme -     Lyme Ab/Western Blot Reflex -     TSH + free T4  Need for prophylactic vaccination and inoculation against influenza -     Flu Vaccine QUAD 36+  mos IM  Other orders -     lisinopril (PRINIVIL,ZESTRIL) 20 MG tablet; Take 1 tablet (20 mg total) by mouth daily.   A total of 25 minutes were spent face-to-face with the patient during this encounter and over half of that time was spent on counseling and coordination of care.   Francis

## 2017-04-17 LAB — LYME AB/WESTERN BLOT REFLEX: Lyme IgG/IgM Ab: <0.91 {ISR} (ref 0.00–0.90)

## 2017-04-17 LAB — TSH+FREE T4
Free T4: 0.84 ng/dL (ref 0.82–1.77)
TSH: 0.809 u[IU]/mL (ref 0.450–4.500)

## 2017-06-01 ENCOUNTER — Other Ambulatory Visit: Payer: Self-pay | Admitting: Family Medicine

## 2017-06-04 NOTE — Telephone Encounter (Signed)
Refill request for lisinopril 20 mg # 30 with 3 refills approved.

## 2018-05-04 IMAGING — DX DG ABDOMEN 1V
2 series · 2 of 2 positions shown · non-contrast
Comparison: CT of the abdomen and pelvis 07/08/2012

CLINICAL DATA: Left lower quadrant abdominal pain. Left flank pain.

EXAM:
ABDOMEN - 1 VIEW

[abdomen kub (1 of 2)]
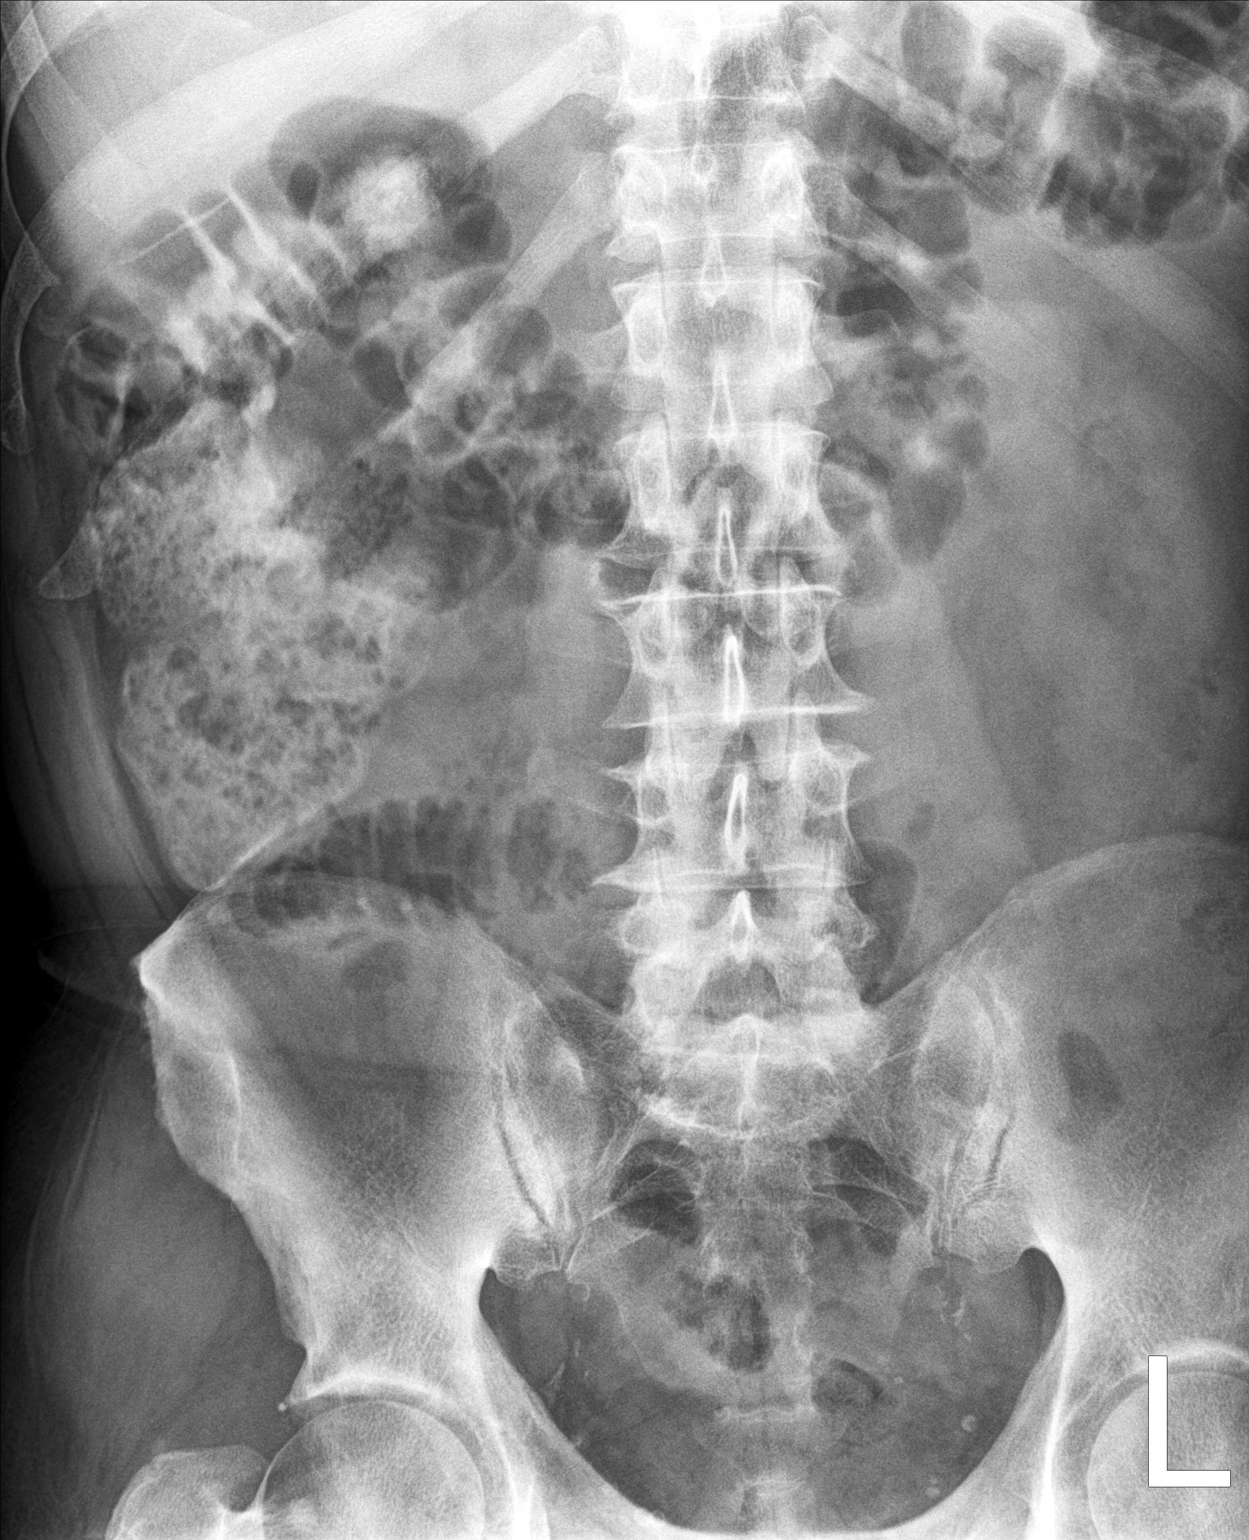

[abdomen kub (2 of 2)]
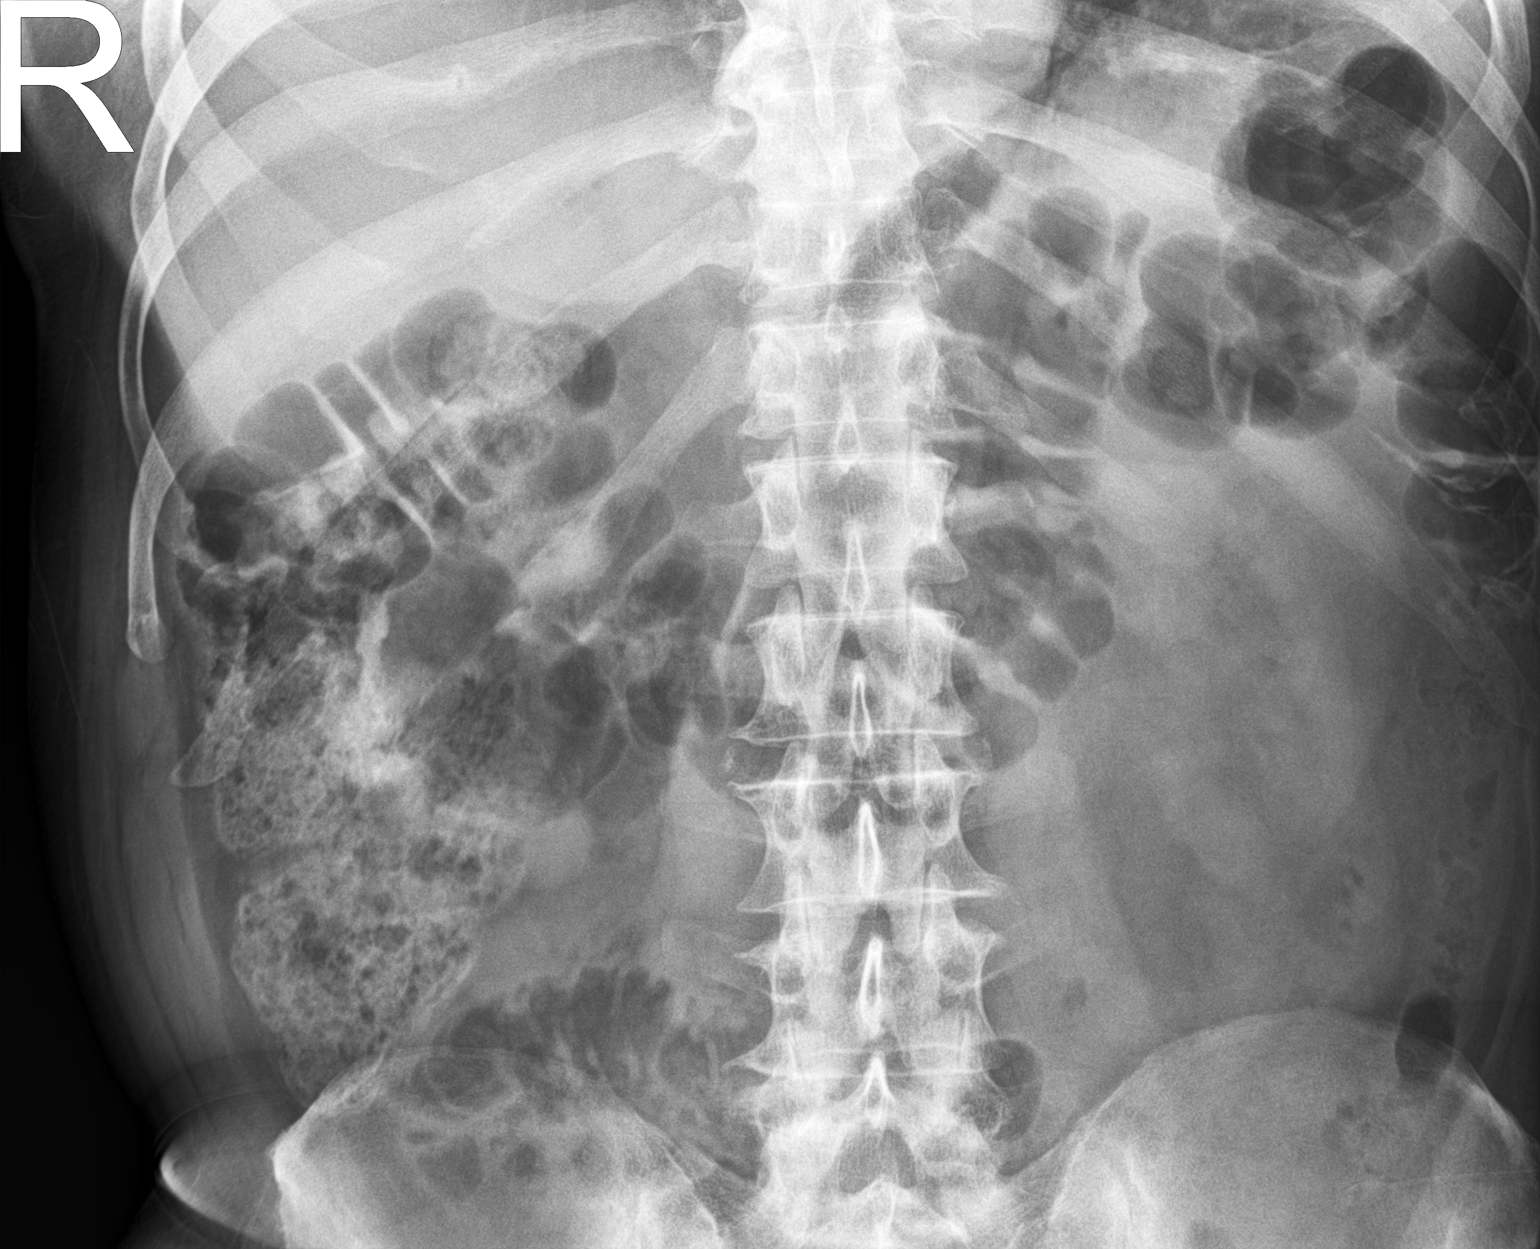

[2 of 2 positions shown; findings below may reference images not displayed]

FINDINGS: The bowel gas pattern is normal. No radio-opaque calculi or other
significant radiographic abnormality are seen.
IMPRESSION: Negative one view abdomen.
